# Patient Record
Sex: Female | Born: 1950 | Race: Black or African American | Hispanic: No | Smoking: Never smoker
Health system: Southern US, Community
[De-identification: ages and names within clinical notes are randomized; demographics above are authoritative.]

## PROBLEM LIST (undated history)

## (undated) DIAGNOSIS — R519 Headache, unspecified: Secondary | ICD-10-CM

## (undated) DIAGNOSIS — M25511 Pain in right shoulder: Secondary | ICD-10-CM

## (undated) DIAGNOSIS — I499 Cardiac arrhythmia, unspecified: Secondary | ICD-10-CM

## (undated) DIAGNOSIS — R51 Headache: Secondary | ICD-10-CM

## (undated) DIAGNOSIS — N390 Urinary tract infection, site not specified: Secondary | ICD-10-CM

## (undated) DIAGNOSIS — R011 Cardiac murmur, unspecified: Secondary | ICD-10-CM

## (undated) DIAGNOSIS — M1990 Unspecified osteoarthritis, unspecified site: Secondary | ICD-10-CM

## (undated) DIAGNOSIS — I10 Essential (primary) hypertension: Secondary | ICD-10-CM

## (undated) HISTORY — DX: Essential (primary) hypertension: I10

## (undated) HISTORY — PX: TUBAL LIGATION: SHX77

---

## 2010-01-16 HISTORY — PX: DILATION AND CURETTAGE OF UTERUS: SHX78

## 2015-02-19 ENCOUNTER — Encounter: Payer: Self-pay | Admitting: *Deleted

## 2015-03-17 ENCOUNTER — Other Ambulatory Visit (HOSPITAL_COMMUNITY)
Admission: RE | Admit: 2015-03-17 | Discharge: 2015-03-17 | Disposition: A | Discharge status: Home / Self Care | Payer: Self-pay | Source: Ambulatory Visit | Attending: Obstetrics & Gynecology | Admitting: Obstetrics & Gynecology

## 2015-03-17 ENCOUNTER — Encounter: Payer: Self-pay | Admitting: Obstetrics & Gynecology

## 2015-03-17 ENCOUNTER — Ambulatory Visit (INDEPENDENT_AMBULATORY_CARE_PROVIDER_SITE_OTHER): Payer: Self-pay | Admitting: Obstetrics & Gynecology

## 2015-03-17 ENCOUNTER — Encounter: Payer: Self-pay | Admitting: *Deleted

## 2015-03-17 VITALS — BP 127/70 | HR 85 | Temp 98.6°F | Ht 63.25 in | Wt 199.8 lb

## 2015-03-17 DIAGNOSIS — N840 Polyp of corpus uteri: Secondary | ICD-10-CM | POA: Insufficient documentation

## 2015-03-17 DIAGNOSIS — N950 Postmenopausal bleeding: Secondary | ICD-10-CM | POA: Insufficient documentation

## 2015-03-17 DIAGNOSIS — Z124 Encounter for screening for malignant neoplasm of cervix: Secondary | ICD-10-CM

## 2015-03-17 DIAGNOSIS — Z1151 Encounter for screening for human papillomavirus (HPV): Secondary | ICD-10-CM

## 2015-03-17 NOTE — Patient Instructions (Signed)
Endometrial Biopsy Endometrial biopsy is a procedure in which a tissue sample is taken from inside the uterus. The tissue sample is then looked at under a microscope to see if the tissue is normal or abnormal. The endometrium is the lining of the uterus. This procedure helps determine where you are in your menstrual cycle and how hormone levels are affecting the lining of the uterus. This procedure may also be used to evaluate uterine bleeding or to diagnose endometrial cancer, tuberculosis, polyps, or inflammatory conditions.  LET YOUR HEALTH CARE PROVIDER KNOW ABOUT:  Any allergies you have.  All medicines you are taking, including vitamins, herbs, eye drops, creams, and over-the-counter medicines.  Previous problems you or members of your family have had with the use of anesthetics.  Any blood disorders you have.  Previous surgeries you have had.  Medical conditions you have.  Possibility of pregnancy. RISKS AND COMPLICATIONS Generally, this is a safe procedure. However, as with any procedure, complications can occur. Possible complications include:  Bleeding.  Pelvic infection.  Puncture of the uterine wall with the biopsy device (rare). BEFORE THE PROCEDURE   Keep a record of your menstrual cycles as directed by your health care provider. You may need to schedule your procedure for a specific time in your cycle.  You may want to bring a sanitary pad to wear home after the procedure.  Arrange for someone to drive you home after the procedure if you will be given a medicine to help you relax (sedative). PROCEDURE   You may be given a sedative to relax you.  You will lie on an exam table with your feet and legs supported as in a pelvic exam.  Your health care provider will insert an instrument (speculum) into your vagina to see your cervix.  Your cervix will be cleansed with an antiseptic solution. A medicine (local anesthetic) will be used to numb the cervix.  A forceps  instrument (tenaculum) will be used to hold your cervix steady for the biopsy.  A thin, rodlike instrument (uterine sound) will be inserted through your cervix to determine the length of your uterus and the location where the biopsy sample will be removed.  A thin, flexible tube (catheter) will be inserted through your cervix and into the uterus. The catheter is used to collect the biopsy sample from your endometrial tissue.  The catheter and speculum will then be removed, and the tissue sample will be sent to a lab for examination. AFTER THE PROCEDURE  You will rest in a recovery area until you are ready to go home.  You may have mild cramping and a small amount of vaginal bleeding for a few days after the procedure. This is normal.  Make sure you find out how to get your test results.   This information is not intended to replace advice given to you by your health care provider. Make sure you discuss any questions you have with your health care provider.   Document Released: 05/05/2004 Document Revised: 09/04/2012 Document Reviewed: 06/19/2012 Elsevier Interactive Patient Education 2016 Elsevier Inc.  

## 2015-03-17 NOTE — Progress Notes (Signed)
Here today for periods of bleeding about 2 months ago. States period had stopped about 4-5 years ago. Since then had spotted a few times.

## 2015-03-17 NOTE — Progress Notes (Signed)
Patient ID: Tanya Nunez, female   DOB: [birth year], 65 y.o.   MRN: [id number]  Chief Complaint  Patient presents with  . Postmenopausal bleeding  referred by [facility], bleeding in December, has had spotting occasionally for years  HPI Tanya Nunez is a 65 y.o. female.  [id number] No LMP recorded. Patient is postmenopausal.Age 66 approximately. No bleeding now but bled like a period for a few days in December. Only pain is right side low back pain   HPI  Past Medical History  Diagnosis Date  . Hypertension     Past Surgical History  Procedure Laterality Date  . Dilation and curettage of uterus  2012  states polyp was Dx  Family History  Problem Relation Age of Onset  . Colonic polyp      Social History Social History  Substance Use Topics  . Smoking status: Never Smoker   . Smokeless tobacco: Never Used  . Alcohol Use: No    No Known Allergies  Current Outpatient Prescriptions  Medication Sig Dispense Refill  . aspirin EC 81 MG tablet Take 81 mg by mouth.    . hydrochlorothiazide (HYDRODIURIL) 25 MG tablet Take 25 mg by mouth daily.    Marland Kitchen losartan (COZAAR) 50 MG tablet Take 50 mg by mouth.    . naproxen (NAPROSYN) 500 MG tablet Take 500 mg by mouth 2 (two) times daily with a meal.    . VITAMIN D, CHOLECALCIFEROL, PO Take 1 tablet by mouth daily.     No current facility-administered medications for this visit.    Review of Systems Review of Systems  Blood pressure 127/70, pulse 85, temperature 98.6 F (37 C), height 5' 3.25" (1.607 m), weight 199 lb 12.8 oz (90.629 kg).  Physical Exam Physical Exam  Constitutional: She appears well-developed. No distress.  Pulmonary/Chest: Effort normal. No respiratory distress.  Genitourinary: Vagina normal. Vaginal discharge: scant pink staining.  Cervix normal, pap done. Uterus 6 week size, no masses  Psychiatric: She has a normal mood and affect. Her behavior is normal.  Patient given informed consent, signed copy in the  chart, time out was performed. Appropriate time out taken. . The patient was placed in the lithotomy position and the cervix brought into view with sterile speculum.  Portio of cervix cleansed x 2 with betadine swabs.  A tenaculum was placed in the anterior lip of the cervix.  The uterus was sounded for depth of 10 cm. A pipelle was introduced to into the uterus, suction created,  and an endometrial sample was obtained. All equipment was removed and accounted for.  The patient tolerated the procedure well.   .  Data Reviewed D&C 2007 path  Result Narrative    Result Narrative     Assessment    Postmenopausal bleeding. Pap and EMBX done     Plan    Patient given post procedure instructions. The patient will return in 2 weeks for results Pelvic US to evaluate endometrium and for pathology.        Tanya Nunez 03/17/2015, 1:44 PM

## 2015-03-18 LAB — WET PREP, GENITAL
Trich, Wet Prep: NONE SEEN
Yeast Wet Prep HPF POC: NONE SEEN

## 2015-03-22 ENCOUNTER — Telehealth: Payer: Self-pay | Admitting: General Practice

## 2015-03-22 LAB — CYTOLOGY - PAP

## 2015-03-22 NOTE — Telephone Encounter (Signed)
Per Dr Roselie Awkward patient's pap & endo bx were normal. Called patient, no answer- left message stating we are trying to reach you with results, please call us back at the clinics

## 2015-03-23 ENCOUNTER — Ambulatory Visit (HOSPITAL_COMMUNITY)
Admission: RE | Admit: 2015-03-23 | Discharge: 2015-03-23 | Disposition: A | Discharge status: Home / Self Care | Payer: Self-pay | Source: Ambulatory Visit | Attending: Obstetrics & Gynecology | Admitting: Obstetrics & Gynecology

## 2015-03-23 DIAGNOSIS — N950 Postmenopausal bleeding: Secondary | ICD-10-CM | POA: Insufficient documentation

## 2015-03-23 DIAGNOSIS — D259 Leiomyoma of uterus, unspecified: Secondary | ICD-10-CM | POA: Insufficient documentation

## 2015-03-23 NOTE — Telephone Encounter (Signed)
Patient called and left message stating she is returning our call. Called patient and informed her of results. Patient verbalized understanding & had no questions

## 2015-04-07 ENCOUNTER — Ambulatory Visit (INDEPENDENT_AMBULATORY_CARE_PROVIDER_SITE_OTHER): Payer: Self-pay | Admitting: Obstetrics & Gynecology

## 2015-04-07 ENCOUNTER — Encounter: Payer: Self-pay | Admitting: Obstetrics & Gynecology

## 2015-04-07 ENCOUNTER — Other Ambulatory Visit: Payer: Self-pay | Admitting: Obstetrics & Gynecology

## 2015-04-07 VITALS — BP 147/68 | HR 86 | Temp 98.3°F | Ht 66.0 in | Wt 201.6 lb

## 2015-04-07 DIAGNOSIS — R9389 Abnormal findings on diagnostic imaging of other specified body structures: Secondary | ICD-10-CM | POA: Insufficient documentation

## 2015-04-07 DIAGNOSIS — N950 Postmenopausal bleeding: Secondary | ICD-10-CM | POA: Insufficient documentation

## 2015-04-07 DIAGNOSIS — E669 Obesity, unspecified: Secondary | ICD-10-CM

## 2015-04-07 DIAGNOSIS — R938 Abnormal findings on diagnostic imaging of other specified body structures: Secondary | ICD-10-CM

## 2015-04-07 MED ORDER — MEGESTROL ACETATE 40 MG PO TABS: 40.0000 mg | ORAL_TABLET | Freq: Two times a day (BID) | ORAL | Status: DC

## 2015-04-07 NOTE — Progress Notes (Signed)
   Subjective:    Patient ID: Tanya Nunez, female    DOB: 1950/02/20, 65 y.o.   MRN: [id number]  HPI  65 yo AA lady here for follow up of her PMB work up. She had some PMB in 12/16. She had a pap, EMBX, and gyn u/s 2 weeks ago. All were normal athough the u/s showed a 5 cm fibroid and a 18 mm endometrium.  Review of Systems     Objective:   Physical Exam [id number] Breathing, conversing, and ambulating well Abd- benign    Assessment & Plan:  PMB with thickened endometrium- rec megace BID for 3 months and then recheck u/s for uterine lining

## 2015-04-07 NOTE — Progress Notes (Signed)
Patient ID: Tanya Nunez, female   DOB: 1950-07-17, 65 y.o.   MRN: [id number] [place] scheduled for June 20th @ 0900.  Pt notified.

## 2015-07-08 ENCOUNTER — Ambulatory Visit (HOSPITAL_COMMUNITY)
Admission: RE | Admit: 2015-07-08 | Discharge: 2015-07-08 | Disposition: A | Discharge status: Home / Self Care | Payer: Medicare Other | Source: Ambulatory Visit | Attending: Obstetrics & Gynecology | Admitting: Obstetrics & Gynecology

## 2015-07-08 DIAGNOSIS — R938 Abnormal findings on diagnostic imaging of other specified body structures: Secondary | ICD-10-CM | POA: Insufficient documentation

## 2015-07-08 DIAGNOSIS — N858 Other specified noninflammatory disorders of uterus: Secondary | ICD-10-CM | POA: Diagnosis not present

## 2015-07-08 DIAGNOSIS — D259 Leiomyoma of uterus, unspecified: Secondary | ICD-10-CM | POA: Insufficient documentation

## 2015-07-08 DIAGNOSIS — R9389 Abnormal findings on diagnostic imaging of other specified body structures: Secondary | ICD-10-CM

## 2015-08-25 ENCOUNTER — Ambulatory Visit (INDEPENDENT_AMBULATORY_CARE_PROVIDER_SITE_OTHER): Payer: Medicare Other | Admitting: Obstetrics & Gynecology

## 2015-08-25 ENCOUNTER — Encounter: Payer: Self-pay | Admitting: Obstetrics & Gynecology

## 2015-08-25 VITALS — BP 164/72 | HR 93 | Wt 191.3 lb

## 2015-08-25 DIAGNOSIS — N950 Postmenopausal bleeding: Secondary | ICD-10-CM

## 2015-08-25 NOTE — Progress Notes (Signed)
   Subjective:    Patient ID: Tanya Nunez, female    DOB: 05-22-1950, 65 y.o.   MRN: [id number]  HPI 65 yo AA P2 here for the results of her gyn u/s. She has had PMB, h/o EMBX, pap smear (all normal). U/s shows fibroids and a thick endometrium. She took megace and endometrium still is thick, 29 mm.    Review of Systems     Objective:   Physical Exam  WNWHBFNAD Breathing, conversing, and ambulating normally     Assessment & Plan:  Thick endometrium, PMB- plan for d&c
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
## 2015-08-27 ENCOUNTER — Encounter (HOSPITAL_COMMUNITY): Payer: Self-pay | Admitting: *Deleted

## 2015-08-30 ENCOUNTER — Other Ambulatory Visit: Payer: Self-pay

## 2015-08-30 ENCOUNTER — Encounter (HOSPITAL_COMMUNITY): Payer: Self-pay

## 2015-08-30 ENCOUNTER — Encounter (HOSPITAL_COMMUNITY)
Admission: RE | Admit: 2015-08-30 | Discharge: 2015-08-30 | Disposition: A | Discharge status: Home / Self Care | Payer: Medicare Other | Source: Ambulatory Visit | Attending: Obstetrics & Gynecology | Admitting: Obstetrics & Gynecology

## 2015-08-30 DIAGNOSIS — R938 Abnormal findings on diagnostic imaging of other specified body structures: Secondary | ICD-10-CM | POA: Diagnosis not present

## 2015-08-30 DIAGNOSIS — N950 Postmenopausal bleeding: Secondary | ICD-10-CM | POA: Diagnosis present

## 2015-08-30 DIAGNOSIS — I10 Essential (primary) hypertension: Secondary | ICD-10-CM | POA: Diagnosis not present

## 2015-08-30 DIAGNOSIS — Z7982 Long term (current) use of aspirin: Secondary | ICD-10-CM | POA: Diagnosis not present

## 2015-08-30 HISTORY — DX: Unspecified osteoarthritis, unspecified site: M19.90

## 2015-08-30 LAB — CBC
HCT: 38.5 % (ref 36.0–46.0)
Hemoglobin: 12.6 g/dL (ref 12.0–15.0)
MCH: 31.5 pg (ref 26.0–34.0)
MCHC: 32.7 g/dL (ref 30.0–36.0)
MCV: 96.3 fL (ref 78.0–100.0)
PLATELETS: 391 10*3/uL (ref 150–400)
RBC: 4 MIL/uL (ref 3.87–5.11)
RDW: 13.2 % (ref 11.5–15.5)
WBC: 9.2 10*3/uL (ref 4.0–10.5)

## 2015-08-30 LAB — BASIC METABOLIC PANEL
Anion gap: 5 (ref 5–15)
BUN: 12 mg/dL (ref 6–20)
CHLORIDE: 109 mmol/L (ref 101–111)
CO2: 24 mmol/L (ref 22–32)
CREATININE: 1.05 mg/dL — AB (ref 0.44–1.00)
Calcium: 8.9 mg/dL (ref 8.9–10.3)
GFR, EST NON AFRICAN AMERICAN: 55 mL/min — AB (ref >60)
Glucose, Bld: 133 mg/dL — ABNORMAL HIGH (ref 65–99)
POTASSIUM: 4 mmol/L (ref 3.5–5.1)
SODIUM: 138 mmol/L (ref 135–145)

## 2015-08-30 NOTE — Anesthesia Preprocedure Evaluation (Addendum)
Anesthesia Evaluation  Patient identified by MRN, date of birth, ID band Patient awake    Reviewed: Allergy & Precautions, NPO status , Patient's Chart, lab work & pertinent test results  History of Anesthesia Complications Negative for: history of anesthetic complications  Airway Mallampati: I  TM Distance: >3 FB Neck ROM: Full    Dental  (+) Edentulous Upper, Dental Advisory Given   Pulmonary neg pulmonary ROS,    Pulmonary exam normal breath sounds clear to auscultation       Cardiovascular hypertension, Pt. on medications (-) angina(-) Past MI, (-) Cardiac Stents and (-) CABG  Rhythm:Regular Rate:Normal     Neuro/Psych negative neurological ROS     GI/Hepatic negative GI ROS, Neg liver ROS,   Endo/Other  negative endocrine ROS  Renal/GU negative Renal ROS     Musculoskeletal  (+) Arthritis ,   Abdominal (+) + obese,   Peds  Hematology negative hematology ROS (+)   Anesthesia Other Findings   Reproductive/Obstetrics                            Anesthesia Physical Anesthesia Plan  ASA: II  Anesthesia Plan: General   Post-op Pain Management:    Induction: Intravenous  Airway Management Planned: LMA  Additional Equipment:   Intra-op Plan:   Post-operative Plan: Extubation in OR  Informed Consent: I have reviewed the patients History and Physical, chart, labs and discussed the procedure including the risks, benefits and alternatives for the proposed anesthesia with the patient or authorized representative who has indicated his/her understanding and acceptance.   Dental advisory given  Plan Discussed with:   Anesthesia Plan Comments:       Anesthesia Quick Evaluation

## 2015-08-30 NOTE — Patient Instructions (Addendum)
Your procedure is scheduled on: Tuesday, 8/15  Enter through the Main Entrance of [facility] at: [place] up the phone at the desk and dial [phone].  Call this number if you have problems the morning of surgery: [phone].  Remember: Do NOT eat or drink after midnight tonight 8/14  Take these medicines the morning of surgery with a SIP OF WATER:  Losartan and hctz  Do NOT wear jewelry (body piercing), metal hair clips/bobby pins, make-up, or nail polish.  Do NOT wear lotions, powders, or perfumes.  You may wear deoderant.  Do NOT shave for 48 hours prior to surgery.  Do NOT bring valuables to the hospital.  Dentures or bridgework may not be worn into surgery.  Have a responsible adult drive you home and stay with you for 24 hours after your procedure.  Home with friend Tanya Nunez cell [phone].

## 2015-08-31 ENCOUNTER — Ambulatory Visit (HOSPITAL_COMMUNITY): Payer: Medicare Other | Admitting: Anesthesiology

## 2015-08-31 ENCOUNTER — Ambulatory Visit (HOSPITAL_COMMUNITY)
Admission: RE | Admit: 2015-08-31 | Discharge: 2015-08-31 | Disposition: A | Discharge status: Home / Self Care | Payer: Medicare Other | Source: Ambulatory Visit | Attending: Obstetrics & Gynecology | Admitting: Obstetrics & Gynecology

## 2015-08-31 ENCOUNTER — Encounter (HOSPITAL_COMMUNITY)
Admission: RE | Disposition: A | Discharge status: Home / Self Care | Payer: Self-pay | Source: Ambulatory Visit | Attending: Obstetrics & Gynecology

## 2015-08-31 ENCOUNTER — Encounter (HOSPITAL_COMMUNITY): Payer: Self-pay | Admitting: *Deleted

## 2015-08-31 DIAGNOSIS — N950 Postmenopausal bleeding: Secondary | ICD-10-CM | POA: Insufficient documentation

## 2015-08-31 DIAGNOSIS — Z7982 Long term (current) use of aspirin: Secondary | ICD-10-CM | POA: Insufficient documentation

## 2015-08-31 DIAGNOSIS — I10 Essential (primary) hypertension: Secondary | ICD-10-CM | POA: Diagnosis not present

## 2015-08-31 DIAGNOSIS — R938 Abnormal findings on diagnostic imaging of other specified body structures: Secondary | ICD-10-CM | POA: Insufficient documentation

## 2015-08-31 HISTORY — PX: DILATION AND CURETTAGE OF UTERUS: SHX78

## 2015-08-31 SURGERY — DILATION AND CURETTAGE
Anesthesia: General | Site: Vagina

## 2015-08-31 MED ORDER — OXYCODONE-ACETAMINOPHEN 5-325 MG PO TABS: 1.0000 | ORAL_TABLET | Freq: Four times a day (QID) | ORAL | 0 refills | Status: DC | PRN

## 2015-08-31 MED ORDER — FENTANYL CITRATE (PF) 100 MCG/2ML IJ SOLN
INTRAMUSCULAR | Status: AC
  Filled 2015-08-31: qty 2

## 2015-08-31 MED ORDER — FENTANYL CITRATE (PF) 100 MCG/2ML IJ SOLN: 25.0000 ug | INTRAMUSCULAR | Status: DC | PRN

## 2015-08-31 MED ORDER — KETOROLAC TROMETHAMINE 30 MG/ML IJ SOLN
INTRAMUSCULAR | Status: DC | PRN
  Administered 2015-08-31: 30 mg via INTRAVENOUS

## 2015-08-31 MED ORDER — PROPOFOL 10 MG/ML IV BOLUS
INTRAVENOUS | Status: AC
  Filled 2015-08-31: qty 20

## 2015-08-31 MED ORDER — BUPIVACAINE HCL 0.5 % IJ SOLN
INTRAMUSCULAR | Status: DC | PRN
  Administered 2015-08-31: 10 mL

## 2015-08-31 MED ORDER — DEXAMETHASONE SODIUM PHOSPHATE 4 MG/ML IJ SOLN
INTRAMUSCULAR | Status: AC
  Filled 2015-08-31: qty 1

## 2015-08-31 MED ORDER — ONDANSETRON HCL 4 MG/2ML IJ SOLN: 4.0000 mg | Freq: Once | INTRAMUSCULAR | Status: DC | PRN

## 2015-08-31 MED ORDER — ONDANSETRON HCL 4 MG/2ML IJ SOLN
INTRAMUSCULAR | Status: AC
  Filled 2015-08-31: qty 2

## 2015-08-31 MED ORDER — LIDOCAINE HCL (CARDIAC) 20 MG/ML IV SOLN
INTRAVENOUS | Status: AC
  Filled 2015-08-31: qty 5

## 2015-08-31 MED ORDER — IBUPROFEN 600 MG PO TABS
600.0000 mg | ORAL_TABLET | Freq: Four times a day (QID) | ORAL | 1 refills | Status: DC | PRN
Start: 2015-08-31 — End: 2015-09-22

## 2015-08-31 MED ORDER — LIDOCAINE HCL (CARDIAC) 20 MG/ML IV SOLN
INTRAVENOUS | Status: DC | PRN
  Administered 2015-08-31: 80 mg via INTRAVENOUS

## 2015-08-31 MED ORDER — FENTANYL CITRATE (PF) 100 MCG/2ML IJ SOLN
INTRAMUSCULAR | Status: DC | PRN
  Administered 2015-08-31: 100 ug via INTRAVENOUS

## 2015-08-31 MED ORDER — LACTATED RINGERS IV SOLN
INTRAVENOUS | Status: DC
  Administered 2015-08-31: 09:00:00 via INTRAVENOUS

## 2015-08-31 MED ORDER — SCOPOLAMINE 1 MG/3DAYS TD PT72
MEDICATED_PATCH | TRANSDERMAL | Status: AC
  Filled 2015-08-31: qty 1

## 2015-08-31 MED ORDER — DEXAMETHASONE SODIUM PHOSPHATE 10 MG/ML IJ SOLN
INTRAMUSCULAR | Status: DC | PRN
  Administered 2015-08-31: 4 mg via INTRAVENOUS

## 2015-08-31 MED ORDER — MIDAZOLAM HCL 2 MG/2ML IJ SOLN
INTRAMUSCULAR | Status: AC
  Filled 2015-08-31: qty 2

## 2015-08-31 MED ORDER — PROPOFOL 10 MG/ML IV BOLUS
INTRAVENOUS | Status: DC | PRN
  Administered 2015-08-31: 150 mg via INTRAVENOUS

## 2015-08-31 MED ORDER — MIDAZOLAM HCL 2 MG/2ML IJ SOLN
INTRAMUSCULAR | Status: DC | PRN
  Administered 2015-08-31: 2 mg via INTRAVENOUS

## 2015-08-31 MED ORDER — LACTATED RINGERS IV SOLN
INTRAVENOUS | Status: DC | PRN
  Administered 2015-08-31: 10:00:00 via INTRAVENOUS

## 2015-08-31 MED ORDER — BUPIVACAINE HCL (PF) 0.5 % IJ SOLN
INTRAMUSCULAR | Status: AC
  Filled 2015-08-31: qty 30

## 2015-08-31 MED ORDER — ONDANSETRON HCL 4 MG/2ML IJ SOLN
INTRAMUSCULAR | Status: DC | PRN
  Administered 2015-08-31: 4 mg via INTRAVENOUS

## 2015-08-31 SURGICAL SUPPLY — 13 items
CLOTH BEACON ORANGE TIMEOUT ST (SAFETY) ×3 IMPLANT
CONTAINER PREFILL 10% NBF 60ML (FORM) ×3 IMPLANT
DILATOR CANAL MILEX (MISCELLANEOUS) IMPLANT
GLOVE BIO SURGEON STRL SZ 6.5 (GLOVE) ×2 IMPLANT
GLOVE BIO SURGEONS STRL SZ 6.5 (GLOVE) ×1
GLOVE BIOGEL PI IND STRL 7.0 (GLOVE) ×1 IMPLANT
GLOVE BIOGEL PI INDICATOR 7.0 (GLOVE) ×2
GOWN STRL REUS W/TWL LRG LVL3 (GOWN DISPOSABLE) ×6 IMPLANT
NEEDLE SPNL 18GX3.5 QUINCKE PK (NEEDLE) ×3 IMPLANT
PACK VAGINAL MINOR WOMEN LF (CUSTOM PROCEDURE TRAY) ×3 IMPLANT
PAD OB MATERNITY 4.3X12.25 (PERSONAL CARE ITEMS) ×3 IMPLANT
PAD PREP 24X48 CUFFED NSTRL (MISCELLANEOUS) ×3 IMPLANT
TOWEL OR 17X24 6PK STRL BLUE (TOWEL DISPOSABLE) ×6 IMPLANT

## 2015-08-31 NOTE — Transfer of Care (Signed)
Immediate Anesthesia Transfer of Care Note  Patient: Tanya Nunez  Procedure(s) Performed: Procedure(s): DILATATION AND CURETTAGE (N/A)  Patient Location: PACU  Anesthesia Type:General  Level of Consciousness: awake, alert  and oriented  Airway & Oxygen Therapy: Patient Spontanous Breathing and Patient connected to nasal cannula oxygen  Post-op Assessment: Report given to RN and Post -op Vital signs reviewed and stable  Post vital signs: Reviewed and stable  Last Vitals:  Vitals:   08/31/15 0859  BP: 126/85  Pulse: 77  Resp: 18  Temp: 37 C    Last Pain:  Vitals:   08/31/15 0859  TempSrc: Oral         Complications: No apparent anesthesia complications

## 2015-08-31 NOTE — Op Note (Signed)
08/31/2015  10:28 AM  PATIENT:  Tanya Nunez  65 y.o. female  PRE-OPERATIVE DIAGNOSIS:   PMB, fibroids and a thick endometrium  POST-OPERATIVE DIAGNOSIS:  PMB, fibroids and a thick endometrium  PROCEDURE:  Procedure(s): DILATATION AND CURETTAGE (N/A)  SURGEON:  Surgeon(s) and Role:    * Emily Filbert, MD - Primary   ANESTHESIA:   general  EBL:  Total I/O In: -  Out: 40 [Urine:25; Blood:15]  BLOOD ADMINISTERED:none  DRAINS: none   LOCAL MEDICATIONS USED:  MARCAINE     SPECIMEN:  Source of Specimen:  uterine curettings  DISPOSITION OF SPECIMEN:  PATHOLOGY  COUNTS:  YES  TOURNIQUET:  * No tourniquets in log *  DICTATION: .Dragon Dictation  PLAN OF CARE: Discharge to home after PACU  PATIENT DISPOSITION:  PACU - hemodynamically stable.   Delay start of Pharmacological VTE agent (>24hrs) due to surgical blood loss or risk of bleeding: not applicable    The risks, benefits, and alternatives of surgery were explained, understood, and accepted. All questions were answered. Consents were signed. In the operating room MAC anesthesia was applied without complication, and she was placed in the dorsal lithotomy position. Her vagina was prepped and draped in the usual sterile fashion.  A bimanual exam revealed a 10 week size , anteverted mobile uterus. Her adnexa were nonenlarged. A speculum was placed and a single-tooth tenaculum was used to grasp the anterior lip of her cervix. A total of 10 mL of 0.5% Marcaine was used to perform a paracervical block. Her uterus sounded to 12 cm. Her cervix was carefully and slowly dilated to accommodate a small curette. A curettage was done in all quadrants and the fundus of the uterus. A large amount of polypoid-type  tissue was obtained. A gritty sensation was appreciated throughout. There was no bleeding noted at the end of the case. She was taken to the recovery room after being extubated. She tolerated the procedure well.

## 2015-08-31 NOTE — H&P (Signed)
Tanya Nunez is an 64 yo AA P2 here for a d&Nunez. She has had PMB, h/o EMBX, pap smear (all normal). U/s shows fibroids and a thick endometrium. She took megace and endometrium still is thick, 29 mm.      No LMP recorded. Patient is postmenopausal.    Past Medical History:  Diagnosis Date  . Arthritis    hands, knees - no meds  . Hypertension   . SVD (spontaneous vaginal delivery)    x 4    Past Surgical History:  Procedure Laterality Date  . DILATION AND CURETTAGE OF UTERUS  2012  . TUBAL LIGATION      Family History  Problem Relation Age of Onset  . Colonic polyp      Social History:  reports that she has never smoked. She has never used smokeless tobacco. She reports that she does not drink alcohol or use drugs.  Allergies: No Known Allergies  Prescriptions Prior to Admission  Medication Sig Dispense Refill Last Dose  . hydrochlorothiazide (HYDRODIURIL) 25 MG tablet Take 25 mg by mouth daily.   08/31/2015 at Unknown time  . losartan (COZAAR) 50 MG tablet Take 50 mg by mouth.   08/31/2015 at Unknown time  . aspirin EC 81 MG tablet Take 81 mg by mouth.   08/29/2015  . megestrol (MEGACE) 40 MG tablet Take 1 tablet (40 mg total) by mouth 2 (two) times daily. 60 tablet 2 08/29/2015  . naproxen (NAPROSYN) 500 MG tablet Take 500 mg by mouth 2 (two) times daily with a meal.   Taking  . VITAMIN D, CHOLECALCIFEROL, PO Take 1 tablet by mouth daily.   08/29/2015    ROS  Blood pressure 126/85, pulse 77, temperature 98.6 F (37 Nunez), temperature source Oral, resp. rate 18, SpO2 99 %. Physical Exam  Heart- rrr Lungs- CTAB Abd- benign  No results found for this or any previous visit (from the past 24 hour(s)).  No results found.  Assessment/Plan: PMB with thick endometrium- plan for d&Nunez  She understands the risks of surgery, including, but not to infection, bleeding, DVTs, damage to bowel, bladder, ureters. She wishes to proceed.     Tanya Nunez Tanya Nunez 08/31/2015, 10:01 AM

## 2015-08-31 NOTE — Discharge Instructions (Signed)
Dilation and Curettage or Vacuum Curettage, Care After Refer to this sheet in the next few weeks. These instructions provide you with information on caring for yourself after your procedure. Your health care provider may also give you more specific instructions. Your treatment has been planned according to current medical practices, but problems sometimes occur. Call your health care provider if you have any problems or questions after your procedure. WHAT TO EXPECT AFTER THE PROCEDURE After your procedure, it is typical to have light cramping and bleeding. This may last for 2 days to 2 weeks after the procedure. HOME CARE INSTRUCTIONS   Do not drive for 24 hours.  Wait 1 week before returning to strenuous activities.  Take your temperature 2 times a day for 4 days and write it down. Provide these temperatures to your health care provider if you develop a fever.  Avoid long periods of standing.  Avoid heavy lifting, pushing, or pulling. Do not lift anything heavier than 10 pounds (4.5 kg).  Limit stair climbing to once or twice a day.  Take rest periods often.  You may resume your usual diet.  Drink enough fluids to keep your urine clear or pale yellow.  Your usual bowel function should return. If you have constipation, you may:  Take a mild laxative with permission from your health care provider.  Add fruit and bran to your diet.  Drink more fluids.  Take showers instead of baths until your health care provider gives you permission to take baths.  Do not go swimming or use a hot tub until your health care provider approves.  Try to have someone with you or available to you the first 24-48 hours, especially if you were given a general anesthetic.  Do not douche, use tampons, or have sex (intercourse) for 2 weeks after the procedure.  Only take over-the-counter or prescription medicines as directed by your health care provider. Do not take aspirin. It can cause  bleeding.  Follow up with your health care provider as directed. SEEK MEDICAL CARE IF:   You have increasing cramps or pain that is not relieved with medicine.  You have abdominal pain that does not seem to be related to the same area of earlier cramping and pain.  You have bad smelling vaginal discharge.  You have a rash.  You are having problems with any medicine. SEEK IMMEDIATE MEDICAL CARE IF:   You have bleeding that is heavier than a normal menstrual period.  You have a fever.  You have chest pain.  You have shortness of breath.  You feel dizzy or feel like fainting.  You pass out.  You have pain in your shoulder strap area.  You have heavy vaginal bleeding with or without blood clots. MAKE SURE YOU:   Understand these instructions.  Will watch your condition.  Will get help right away if you are not doing well or get worse.   This information is not intended to replace advice given to you by your health care provider. Make sure you discuss any questions you have with your health care provider.   Document Released: 12/31/1999 Document Revised: 01/07/2013 Document Reviewed: 08/01/2012 Elsevier Interactive Patient Education 2016 Elsevier Inc.  

## 2015-08-31 NOTE — Anesthesia Procedure Notes (Signed)
Procedure Name: LMA Insertion Date/Time: 08/31/2015 10:03 AM Performed by: Jesten Cappuccio, Sheron Nightingale Pre-anesthesia Checklist: Patient identified, Emergency Drugs available, Suction available, Patient being monitored and Timeout performed Patient Re-evaluated:Patient Re-evaluated prior to inductionOxygen Delivery Method: Circle system utilized Preoxygenation: Pre-oxygenation with 100% oxygen Intubation Type: IV induction LMA: LMA inserted LMA Size: 4.0 Grade View: Grade I Number of attempts: 1

## 2015-08-31 NOTE — Anesthesia Postprocedure Evaluation (Signed)
Anesthesia Post Note  Patient: Tanya Nunez  Procedure(s) Performed: Procedure(s) (LRB): DILATATION AND CURETTAGE (N/A)  Patient location during evaluation: PACU Anesthesia Type: General Level of consciousness: awake and alert Pain management: pain level controlled Vital Signs Assessment: post-procedure vital signs reviewed and stable Respiratory status: spontaneous breathing, nonlabored ventilation and respiratory function stable Cardiovascular status: blood pressure returned to baseline and stable Postop Assessment: no signs of nausea or vomiting Anesthetic complications: no     Last Vitals:  Vitals:   08/31/15 1130 08/31/15 1145  BP: 123/68   Pulse: 64 65  Resp: 16 17  Temp:  36.8 C    Last Pain:  Vitals:   08/31/15 1145  TempSrc:   PainSc: 0-No pain   Pain Goal: Patients Stated Pain Goal: 3 (08/31/15 1145)               Nilda Simmer

## 2015-09-01 ENCOUNTER — Encounter (HOSPITAL_COMMUNITY): Payer: Self-pay | Admitting: Obstetrics & Gynecology

## 2015-09-21 ENCOUNTER — Telehealth: Payer: Self-pay | Admitting: *Deleted

## 2015-09-21 NOTE — Telephone Encounter (Addendum)
Pt left message stating that she had D&C by Dr. Hulan Fray about 3 weeks ago. She needs follow up appt and also has started having heavy bleeding w/clots. Please call back.  I returned pt's call and discussed her concern. She stated that she began having heavy bleeding and passing clots 3 days ago. As of today, the bleeding has slowed down some and she is no longer passing clots. I advised pt that Dr. Hulan Fray is not in the office this week however she may have appt with another physician in the office tomorrow @ 1340. Pt voiced understanding and agreed to appt as offered.

## 2015-09-22 ENCOUNTER — Encounter: Payer: Self-pay | Admitting: Family Medicine

## 2015-09-22 ENCOUNTER — Ambulatory Visit (INDEPENDENT_AMBULATORY_CARE_PROVIDER_SITE_OTHER): Payer: Medicare Other | Admitting: Family Medicine

## 2015-09-22 DIAGNOSIS — N950 Postmenopausal bleeding: Secondary | ICD-10-CM

## 2015-09-22 NOTE — Progress Notes (Signed)
   Subjective:    Patient ID: Tanya Nunez is a 65 y.o. female presenting with Menorrhagia  on 09/22/2015  HPI: Here to day to f/u following post-menopausal bleeding. Underwent D and C with Dr. Hulan Fray on 8/15. Since that time has not had any break from bleeding. Now passing a lot of clots. Pathology reviewed and noted progestational effect and necrosis. Has stopped the Megace.  Review of Systems  Constitutional: Negative for chills and fever.  Respiratory: Negative for shortness of breath.   Cardiovascular: Negative for chest pain.  Gastrointestinal: Negative for abdominal pain, nausea and vomiting.  Genitourinary: Negative for dysuria.  Skin: Negative for rash.      Objective:    BP (!) 143/68 (BP Location: Left Arm, Patient Position: Sitting, Cuff Size: Large)   Wt 197 lb 1.6 oz (89.4 kg)   BMI 33.83 kg/m  Physical Exam  Constitutional: She is oriented to person, place, and time. She appears well-developed and well-nourished. No distress.  HENT:  Head: Normocephalic and atraumatic.  Eyes: No scleral icterus.  Neck: Neck supple.  Cardiovascular: Normal rate.   Pulmonary/Chest: Effort normal.  Abdominal: Soft.  Neurological: She is alert and oriented to person, place, and time.  Skin: Skin is warm and dry.  Psychiatric: She has a normal mood and affect.      Assessment & Plan:   Problem List Items Addressed This Visit      Unprioritized   PMB (postmenopausal bleeding)    Given continued heavy bleeding, could do SIS vs. Hysteroscopy with additional sampling. --will proceed with this, due to previous pathology reports.       Other Visit Diagnoses   None.     Total face-to-face time with patient: 15 minutes. Over 50% of encounter was spent on counseling and coordination of care. Return in about 4 weeks (around 10/20/2015).  [name] 09/22/2015 2:07 PM

## 2015-09-22 NOTE — Patient Instructions (Addendum)
Hysteroscopy °Hysteroscopy is a procedure used for looking inside the womb (uterus). It may be done for various reasons, including: °· To evaluate abnormal bleeding, fibroid (benign, noncancerous) tumors, polyps, scar tissue (adhesions), and possibly cancer of the uterus. °· To look for lumps (tumors) and other uterine growths. °· To look for causes of why a woman cannot get pregnant (infertility), causes of recurrent loss of pregnancy (miscarriages), or a lost intrauterine device (IUD). °· To perform a sterilization by blocking the fallopian tubes from inside the uterus. °In this procedure, a thin, flexible tube with a tiny light and camera on the end of it (hysteroscope) is used to look inside the uterus. A hysteroscopy should be done right after a menstrual period to be sure you are not pregnant. °LET YOUR HEALTH CARE PROVIDER KNOW ABOUT:  °· Any allergies you have. °· All medicines you are taking, including vitamins, herbs, eye drops, creams, and over-the-counter medicines. °· Previous problems you or members of your family have had with the use of anesthetics. °· Any blood disorders you have. °· Previous surgeries you have had. °· Medical conditions you have. °RISKS AND COMPLICATIONS  °Generally, this is a safe procedure. However, as with any procedure, complications can occur. Possible complications include: °· Putting a hole in the uterus. °· Excessive bleeding. °· Infection. °· Damage to the cervix. °· Injury to other organs. °· Allergic reaction to medicines. °· Too much fluid used in the uterus for the procedure. °BEFORE THE PROCEDURE  °· Ask your health care provider about changing or stopping any regular medicines. °· Do not take aspirin or blood thinners for 1 week before the procedure, or as directed by your health care provider. These can cause bleeding. °· If you smoke, do not smoke for 2 weeks before the procedure. °· In some cases, a medicine is placed in the cervix the day before the procedure.  This medicine makes the cervix have a larger opening (dilate). This makes it easier for the instrument to be inserted into the uterus during the procedure. °· Do not eat or drink anything for at least 8 hours before the surgery. °· Arrange for someone to take you home after the procedure. °PROCEDURE  °· You may be given a medicine to relax you (sedative). You may also be given one of the following: °¨ A medicine that numbs the area around the cervix (local anesthetic). °¨ A medicine that makes you sleep through the procedure (general anesthetic). °· The hysteroscope is inserted through the vagina into the uterus. The camera on the hysteroscope sends a picture to a TV screen. This gives the surgeon a good view inside the uterus. °· During the procedure, air or a liquid is put into the uterus, which allows the surgeon to see better. °· Sometimes, tissue is gently scraped from inside the uterus. These tissue samples are sent to a lab for testing. °AFTER THE PROCEDURE  °· If you had a general anesthetic, you may be groggy for a couple hours after the procedure. °· If you had a local anesthetic, you will be able to go home as soon as you are stable and feel ready. °· You may have some cramping. This normally lasts for a couple days. °· You may have bleeding, which varies from light spotting for a few days to menstrual-like bleeding for 3-7 days. This is normal. °· If your test results are not back during the visit, make an appointment with your health care provider to find out the   results.   This information is not intended to replace advice given to you by your health care provider. Make sure you discuss any questions you have with your health care provider.   Document Released: 04/10/2000 Document Revised: 10/23/2012 Document Reviewed: 08/01/2012 Elsevier Interactive Patient Education [employer].

## 2015-09-22 NOTE — Assessment & Plan Note (Signed)
Given continued heavy bleeding, could do SIS vs. Hysteroscopy with additional sampling. --will proceed with this, due to previous pathology reports.

## 2015-09-27 ENCOUNTER — Encounter (HOSPITAL_COMMUNITY): Payer: Self-pay

## 2015-09-27 ENCOUNTER — Encounter (HOSPITAL_COMMUNITY)
Admission: RE | Admit: 2015-09-27 | Discharge: 2015-09-27 | Disposition: A | Discharge status: Home / Self Care | Payer: Medicare Other | Source: Ambulatory Visit | Attending: Family Medicine | Admitting: Family Medicine

## 2015-09-27 DIAGNOSIS — N840 Polyp of corpus uteri: Secondary | ICD-10-CM | POA: Diagnosis not present

## 2015-09-27 DIAGNOSIS — N950 Postmenopausal bleeding: Secondary | ICD-10-CM | POA: Diagnosis not present

## 2015-09-27 DIAGNOSIS — I10 Essential (primary) hypertension: Secondary | ICD-10-CM | POA: Diagnosis not present

## 2015-09-27 DIAGNOSIS — Z7982 Long term (current) use of aspirin: Secondary | ICD-10-CM | POA: Diagnosis not present

## 2015-09-27 HISTORY — DX: Headache: R51

## 2015-09-27 HISTORY — DX: Cardiac arrhythmia, unspecified: I49.9

## 2015-09-27 HISTORY — DX: Headache, unspecified: R51.9

## 2015-09-27 LAB — BASIC METABOLIC PANEL
ANION GAP: 4 — AB (ref 5–15)
BUN: 10 mg/dL (ref 6–20)
CHLORIDE: 108 mmol/L (ref 101–111)
CO2: 26 mmol/L (ref 22–32)
Calcium: 8.9 mg/dL (ref 8.9–10.3)
Creatinine, Ser: 0.92 mg/dL (ref 0.44–1.00)
Glucose, Bld: 123 mg/dL — ABNORMAL HIGH (ref 65–99)
POTASSIUM: 3.7 mmol/L (ref 3.5–5.1)
SODIUM: 138 mmol/L (ref 135–145)

## 2015-09-27 LAB — CBC
HCT: 29.5 % — ABNORMAL LOW (ref 36.0–46.0)
HEMOGLOBIN: 9.2 g/dL — AB (ref 12.0–15.0)
MCH: 31.2 pg (ref 26.0–34.0)
MCHC: 31.2 g/dL (ref 30.0–36.0)
MCV: 100 fL (ref 78.0–100.0)
PLATELETS: 431 10*3/uL — AB (ref 150–400)
RBC: 2.95 MIL/uL — AB (ref 3.87–5.11)
RDW: 14.1 % (ref 11.5–15.5)
WBC: 12.2 10*3/uL — AB (ref 4.0–10.5)

## 2015-09-27 NOTE — H&P (Signed)
  Tanya Nunez is an 65 y.o. [phone] female.   Chief Complaint: Postmenopausal bleeding HPI:  Underwent D and C with Dr. Hulan Fray on 8/15. Since that time has not had any break from bleeding. Now passing a lot of clots. Pathology reviewed and noted progestational effect and necrosis. Has stopped the Megace.  Past Medical History:  Diagnosis Date  . Arthritis    hands, knees - no meds  . Headache    history of Migraines  . Hypertension   . Irregular heart beats   . SVD (spontaneous vaginal delivery)    x 4    Past Surgical History:  Procedure Laterality Date  . DILATION AND CURETTAGE OF UTERUS  2012  . DILATION AND CURETTAGE OF UTERUS N/A 08/31/2015   Procedure: DILATATION AND CURETTAGE;  Surgeon: Emily Filbert, MD;  Location: [facility];  Service: Gynecology;  Laterality: N/A;  . TUBAL LIGATION      Family History  Problem Relation Age of Onset  . Colonic polyp     Social History:  reports that she has never smoked. She has never used smokeless tobacco. She reports that she does not drink alcohol or use drugs.  Allergies: No Known Allergies  No current facility-administered medications on file prior to encounter.    Current Outpatient Prescriptions on File Prior to Encounter  Medication Sig Dispense Refill  . aspirin EC 81 MG tablet Take 81 mg by mouth.    . hydrochlorothiazide (HYDRODIURIL) 25 MG tablet Take 25 mg by mouth daily.    Marland Kitchen losartan (COZAAR) 50 MG tablet Take 50 mg by mouth.    . naproxen (NAPROSYN) 500 MG tablet Take 500 mg by mouth 2 (two) times daily with a meal.    . oxyCODONE-acetaminophen (PERCOCET/ROXICET) 5-325 MG tablet Take 1-2 tablets by mouth every 6 (six) hours as needed. (Patient not taking: Reported on 09/22/2015) 10 tablet 0  . VITAMIN D, CHOLECALCIFEROL, PO Take 1 tablet by mouth daily.      A comprehensive review of systems was negative.  There were no vitals taken for this visit. General appearance: alert, cooperative and appears stated age Head:  Normocephalic, without obvious abnormality, atraumatic Neck: supple, symmetrical, trachea midline Lungs: normal effort Heart: regular rate and rhythm Abdomen: gravid, NT Extremities: Homans sign is negative, no sign of DVT Skin: Skin color, texture, turgor normal. No rashes or lesions Neurologic: Grossly normal   Lab Results  Component Value Date   WBC 12.2 (H) 09/27/2015   HGB 9.2 (L) 09/27/2015   HCT 29.5 (L) 09/27/2015   MCV 100.0 09/27/2015   PLT 431 (H) 09/27/2015   No results found for: PREGTESTUR, PREGSERUM, HCG, HCGQUANT   Assessment/Plan Principal Problem:   Postmenopausal bleeding  For D and C with hysteroscopy Risks include but are not limited to bleeding, infection, injury to surrounding structures, including bowel, bladder and ureters, blood clots, and death.  Likelihood of success is high.    [name] 09/27/2015, 5:17 PM

## 2015-09-27 NOTE — Patient Instructions (Signed)
Your procedure is scheduled on:  Thursday, Sept 14, 2017  Enter through the Main Entrance of [facility] at:  1:30PM  Pick up the phone at the desk and dial [phone].  Call this number if you have problems the morning of surgery: [phone].  Remember: Do NOT eat food:  After Midnight Wednesday  Do NOT drink clear liquids after:  11:00 AM day of surgery  Take these medicines the morning of surgery with a SIP OF WATER:  Hydrochlorothiazide, Losartan  Do NOT wear jewelry (body piercing), metal hair clips/bobby pins, make-up, or nail polish. Do NOT wear lotions, powders, or perfumes.  You may wear deodorant. Do NOT shave for 48 hours prior to surgery. Do NOT bring valuables to the hospital. Contacts, dentures, or bridgework may not be worn into surgery.  Have a responsible adult drive you home and stay with you for 24 hours after your procedure

## 2015-09-30 ENCOUNTER — Encounter (HOSPITAL_COMMUNITY)
Admission: RE | Disposition: A | Discharge status: Home / Self Care | Payer: Self-pay | Source: Ambulatory Visit | Attending: Family Medicine

## 2015-09-30 ENCOUNTER — Ambulatory Visit (HOSPITAL_COMMUNITY)
Admission: RE | Admit: 2015-09-30 | Discharge: 2015-09-30 | Disposition: A | Discharge status: Home / Self Care | Payer: Medicare Other | Source: Ambulatory Visit | Attending: Family Medicine | Admitting: Family Medicine

## 2015-09-30 ENCOUNTER — Ambulatory Visit (HOSPITAL_COMMUNITY): Payer: Medicare Other | Admitting: Anesthesiology

## 2015-09-30 ENCOUNTER — Encounter (HOSPITAL_COMMUNITY): Payer: Self-pay

## 2015-09-30 DIAGNOSIS — I10 Essential (primary) hypertension: Secondary | ICD-10-CM | POA: Diagnosis not present

## 2015-09-30 DIAGNOSIS — N840 Polyp of corpus uteri: Secondary | ICD-10-CM | POA: Diagnosis not present

## 2015-09-30 DIAGNOSIS — Z7982 Long term (current) use of aspirin: Secondary | ICD-10-CM | POA: Insufficient documentation

## 2015-09-30 DIAGNOSIS — N950 Postmenopausal bleeding: Secondary | ICD-10-CM | POA: Insufficient documentation

## 2015-09-30 HISTORY — PX: HYSTEROSCOPY WITH D & C: SHX1775

## 2015-09-30 LAB — ABO/RH: ABO/RH(D): O POS

## 2015-09-30 LAB — TYPE AND SCREEN
ABO/RH(D): O POS
ANTIBODY SCREEN: NEGATIVE

## 2015-09-30 SURGERY — DILATATION AND CURETTAGE /HYSTEROSCOPY
Anesthesia: General | Site: Vagina

## 2015-09-30 MED ORDER — BUPIVACAINE-EPINEPHRINE (PF) 0.25% -1:200000 IJ SOLN
INTRAMUSCULAR | Status: AC
Start: 2015-09-30 — End: 2015-09-30
  Filled 2015-09-30: qty 30

## 2015-09-30 MED ORDER — FENTANYL CITRATE (PF) 100 MCG/2ML IJ SOLN
INTRAMUSCULAR | Status: DC | PRN
  Administered 2015-09-30 (×2): 50 ug via INTRAVENOUS

## 2015-09-30 MED ORDER — ONDANSETRON HCL 4 MG/2ML IJ SOLN
INTRAMUSCULAR | Status: AC
  Filled 2015-09-30: qty 2

## 2015-09-30 MED ORDER — DEXAMETHASONE SODIUM PHOSPHATE 4 MG/ML IJ SOLN
INTRAMUSCULAR | Status: AC
  Filled 2015-09-30: qty 1

## 2015-09-30 MED ORDER — LACTATED RINGERS IV SOLN: INTRAVENOUS | Status: DC

## 2015-09-30 MED ORDER — METOCLOPRAMIDE HCL 5 MG/ML IJ SOLN: 10.0000 mg | Freq: Once | INTRAMUSCULAR | Status: DC | PRN

## 2015-09-30 MED ORDER — ONDANSETRON HCL 4 MG/2ML IJ SOLN
INTRAMUSCULAR | Status: DC | PRN
  Administered 2015-09-30: 4 mg via INTRAVENOUS

## 2015-09-30 MED ORDER — PHENYLEPHRINE HCL 10 MG/ML IJ SOLN
INTRAMUSCULAR | Status: DC | PRN
  Administered 2015-09-30 (×3): 40 ug via INTRAVENOUS
  Administered 2015-09-30 (×3): 80 ug via INTRAVENOUS
  Administered 2015-09-30 (×3): 40 ug via INTRAVENOUS
  Administered 2015-09-30: 80 ug via INTRAVENOUS
  Administered 2015-09-30: 40 ug via INTRAVENOUS

## 2015-09-30 MED ORDER — LIDOCAINE HCL (CARDIAC) 20 MG/ML IV SOLN
INTRAVENOUS | Status: DC | PRN
  Administered 2015-09-30: 100 mg via INTRAVENOUS

## 2015-09-30 MED ORDER — PHENYLEPHRINE 40 MCG/ML (10ML) SYRINGE FOR IV PUSH (FOR BLOOD PRESSURE SUPPORT)
PREFILLED_SYRINGE | INTRAVENOUS | Status: AC
  Filled 2015-09-30: qty 20

## 2015-09-30 MED ORDER — SODIUM CHLORIDE 0.9 % IR SOLN
Status: DC | PRN
  Administered 2015-09-30: 3000 mL

## 2015-09-30 MED ORDER — DEXAMETHASONE SODIUM PHOSPHATE 10 MG/ML IJ SOLN
INTRAMUSCULAR | Status: DC | PRN
  Administered 2015-09-30: 4 mg via INTRAVENOUS

## 2015-09-30 MED ORDER — FENTANYL CITRATE (PF) 100 MCG/2ML IJ SOLN: 25.0000 ug | INTRAMUSCULAR | Status: DC | PRN

## 2015-09-30 MED ORDER — LACTATED RINGERS IV SOLN
INTRAVENOUS | Status: DC
  Administered 2015-09-30 (×2): via INTRAVENOUS

## 2015-09-30 MED ORDER — PROPOFOL 10 MG/ML IV BOLUS
INTRAVENOUS | Status: DC | PRN
  Administered 2015-09-30: 180 mg via INTRAVENOUS

## 2015-09-30 MED ORDER — HYDROCODONE-ACETAMINOPHEN 7.5-325 MG PO TABS: 1.0000 | ORAL_TABLET | Freq: Once | ORAL | Status: DC | PRN

## 2015-09-30 MED ORDER — PHENYLEPHRINE 40 MCG/ML (10ML) SYRINGE FOR IV PUSH (FOR BLOOD PRESSURE SUPPORT)
PREFILLED_SYRINGE | INTRAVENOUS | Status: AC
  Filled 2015-09-30: qty 10

## 2015-09-30 MED ORDER — MIDAZOLAM HCL 2 MG/2ML IJ SOLN
INTRAMUSCULAR | Status: AC
  Filled 2015-09-30: qty 2

## 2015-09-30 MED ORDER — PROPOFOL 10 MG/ML IV BOLUS
INTRAVENOUS | Status: AC
  Filled 2015-09-30: qty 20

## 2015-09-30 MED ORDER — MIDAZOLAM HCL 2 MG/2ML IJ SOLN
INTRAMUSCULAR | Status: DC | PRN
  Administered 2015-09-30: 1 mg via INTRAVENOUS

## 2015-09-30 MED ORDER — LIDOCAINE HCL (CARDIAC) 20 MG/ML IV SOLN
INTRAVENOUS | Status: AC
  Filled 2015-09-30: qty 5

## 2015-09-30 MED ORDER — MEPERIDINE HCL 25 MG/ML IJ SOLN: 6.2500 mg | INTRAMUSCULAR | Status: DC | PRN

## 2015-09-30 MED ORDER — FENTANYL CITRATE (PF) 100 MCG/2ML IJ SOLN
INTRAMUSCULAR | Status: AC
  Filled 2015-09-30: qty 2

## 2015-09-30 MED ORDER — BUPIVACAINE-EPINEPHRINE 0.25% -1:200000 IJ SOLN
INTRAMUSCULAR | Status: DC | PRN
  Administered 2015-09-30: 10 mL

## 2015-09-30 SURGICAL SUPPLY — 20 items
BIPOLAR CUTTING LOOP 21FR (ELECTRODE)
CANISTER SUCT 3000ML (MISCELLANEOUS) ×3 IMPLANT
CATH ROBINSON RED A/P 16FR (CATHETERS) ×3 IMPLANT
CLOTH BEACON ORANGE TIMEOUT ST (SAFETY) ×3 IMPLANT
CONTAINER PREFILL 10% NBF 60ML (FORM) ×6 IMPLANT
DEVICE MYOSURE LITE (MISCELLANEOUS) ×3 IMPLANT
ELECT REM PT RETURN 9FT ADLT (ELECTROSURGICAL)
ELECTRODE REM PT RTRN 9FT ADLT (ELECTROSURGICAL) IMPLANT
GLOVE BIOGEL PI IND STRL 7.0 (GLOVE) ×2 IMPLANT
GLOVE BIOGEL PI INDICATOR 7.0 (GLOVE) ×4
GLOVE ECLIPSE 7.0 STRL STRAW (GLOVE) ×6 IMPLANT
GOWN STRL REUS W/TWL LRG LVL3 (GOWN DISPOSABLE) ×9 IMPLANT
LOOP CUTTING BIPOLAR 21FR (ELECTRODE) IMPLANT
PACK VAGINAL MINOR WOMEN LF (CUSTOM PROCEDURE TRAY) ×3 IMPLANT
PAD OB MATERNITY 4.3X12.25 (PERSONAL CARE ITEMS) ×3 IMPLANT
SEAL ROD LENS SCOPE MYOSURE (ABLATOR) ×3 IMPLANT
TOWEL OR 17X24 6PK STRL BLUE (TOWEL DISPOSABLE) ×6 IMPLANT
TUBING AQUILEX INFLOW (TUBING) ×3 IMPLANT
TUBING AQUILEX OUTFLOW (TUBING) ×3 IMPLANT
WATER STERILE IRR 1000ML POUR (IV SOLUTION) ×3 IMPLANT

## 2015-09-30 NOTE — Op Note (Signed)
Preoperative diagnoses:  Postmenopausal bleeding  Postoperative diagnosis: Same  Procedure: D & C, hysteroscopy  Surgeon: Standley Dakins. Kennon Rounds, MD  Anesthesia: Josephine Igo, MD  Findings: large amount of clot, fluffy endometrium, nodular areas of endomtrium  Fluid deficit: 550  Estimated blood loss: Minimum  Pathology: Endometrial curettings  Indications for procedure: 65 y.o. [id number] with history of postmenopausal bleeding, who is s/p D and C for above with benign pathology who has continued to bleed since that time who presents for repeat sampling with hysteroscopy added.  Procedure: The patient was taken to the operating room where spinal analgesia was inserted. SCDs were in place.  Time out was performed. Patient was placed in dorsolithotomy in [place]. She was prepped and draped in the usual sterile fashion. A Red Rubber catheter was used to drain her bladder. A speculum was placeed in the vagina. The cervix was visualized anteriorly and grasped with a single-tooth tenaculum. Paracervical block was performed with 0.25% marcaine with epi with 10 cc injected. The uterus sounded to 7 cm. Sequential dilation was performed with Kennon Rounds dilators. The hysteroscope was inserted and the endometrial cavity and inspected. There were above findings noted in the endometrial cavity with both ostia seen.  The hysteroscope was removed. Sharp curettage was performed in all 4 quadrants. Tissue was present. Repeat hysteroscopy revealed some retained tissue. MyoSure introduced to  Better sample the cavity and sampling done in 4 quadrants. All instruments were removed from the vagina. All instrument, needle and lap counts were correct x 2. The patient was awakened and is recovering in stable condition.  [name] MD 09/30/2015 3:45 PM

## 2015-09-30 NOTE — Anesthesia Postprocedure Evaluation (Signed)
Anesthesia Post Note  Patient: Tanya Nunez  Procedure(s) Performed: Procedure(s) (LRB): DILATATION AND CURETTAGE /HYSTEROSCOPY (N/A)  Patient location during evaluation: PACU Anesthesia Type: General Level of consciousness: awake and alert and oriented Pain management: pain level controlled Vital Signs Assessment: post-procedure vital signs reviewed and stable Respiratory status: spontaneous breathing, nonlabored ventilation and respiratory function stable Cardiovascular status: blood pressure returned to baseline and stable Postop Assessment: no signs of nausea or vomiting Anesthetic complications: no     Last Vitals:  Vitals:   09/30/15 1630 09/30/15 1645  BP: 131/68 132/70  Pulse: 78 80  Resp: 17 17  Temp:  37.4 C    Last Pain: There were no vitals filed for this visit. Pain Goal: Patients Stated Pain Goal: 3 (09/30/15 1645)               Camora Tremain A.

## 2015-09-30 NOTE — Anesthesia Preprocedure Evaluation (Addendum)
Anesthesia Evaluation  Patient identified by MRN, date of birth, ID band Patient awake    Reviewed: Allergy & Precautions, NPO status , Patient's Chart, lab work & pertinent test results  Airway Mallampati: III  TM Distance: >3 FB     Dental  (+) Upper Dentures   Pulmonary neg pulmonary ROS,    Pulmonary exam normal breath sounds clear to auscultation       Cardiovascular hypertension, Pt. on medications Normal cardiovascular exam Rhythm:Regular Rate:Normal     Neuro/Psych  Headaches, negative psych ROS   GI/Hepatic negative GI ROS, Neg liver ROS,   Endo/Other  negative endocrine ROS  Renal/GU negative Renal ROS  negative genitourinary   Musculoskeletal  (+) Arthritis ,   Abdominal (+) + obese,   Peds  Hematology  (+) anemia ,   Anesthesia Other Findings   Reproductive/Obstetrics Post Menopausal bleeding                            Lab Results  Component Value Date   WBC 12.2 (H) 09/27/2015   HGB 9.2 (L) 09/27/2015   HCT 29.5 (L) 09/27/2015   MCV 100.0 09/27/2015   PLT 431 (H) 09/27/2015     Chemistry      Component Value Date/Time   NA 138 09/27/2015 1150   K 3.7 09/27/2015 1150   CL 108 09/27/2015 1150   CO2 26 09/27/2015 1150   BUN 10 09/27/2015 1150   CREATININE 0.92 09/27/2015 1150      Component Value Date/Time   CALCIUM 8.9 09/27/2015 1150      Anesthesia Physical Anesthesia Plan  ASA: II  Anesthesia Plan: General   Post-op Pain Management:    Induction: Intravenous  Airway Management Planned: LMA  Additional Equipment:   Intra-op Plan:   Post-operative Plan: Extubation in OR  Informed Consent: I have reviewed the patients History and Physical, chart, labs and discussed the procedure including the risks, benefits and alternatives for the proposed anesthesia with the patient or authorized representative who has indicated his/her understanding and  acceptance.   Dental advisory given  Plan Discussed with: Anesthesiologist, CRNA and Surgeon  Anesthesia Plan Comments:         Anesthesia Quick Evaluation

## 2015-09-30 NOTE — Transfer of Care (Signed)
Immediate Anesthesia Transfer of Care Note  Patient: Tanya Nunez  Procedure(s) Performed: Procedure(s): DILATATION AND CURETTAGE /HYSTEROSCOPY (N/A)  Patient Location: PACU  Anesthesia Type:General  Level of Consciousness: awake, alert  and oriented  Airway & Oxygen Therapy: Patient Spontanous Breathing and Patient connected to nasal cannula oxygen  Post-op Assessment: Report given to RN and Post -op Vital signs reviewed and stable  Post vital signs: Reviewed and stable  Last Vitals:  Vitals:   09/30/15 1249  BP: 130/75  Pulse: 85  Resp: (!) 22  Temp: 36.8 C    Last Pain: There were no vitals filed for this visit.    Patients Stated Pain Goal: 3 ([id number])  Complications: No apparent anesthesia complications

## 2015-09-30 NOTE — Anesthesia Procedure Notes (Signed)
Procedure Name: LMA Insertion Date/Time: 09/30/2015 3:14 PM Performed by: Jonna Munro Pre-anesthesia Checklist: Patient identified, Emergency Drugs available, Suction available, Patient being monitored and Timeout performed Patient Re-evaluated:Patient Re-evaluated prior to inductionOxygen Delivery Method: Circle system utilized Preoxygenation: Pre-oxygenation with 100% oxygen Intubation Type: IV induction LMA: LMA inserted LMA Size: 4.0 Number of attempts: 1 Placement Confirmation: positive ETCO2 and breath sounds checked- equal and bilateral Tube secured with: Tape Dental Injury: Teeth and Oropharynx as per pre-operative assessment

## 2015-09-30 NOTE — Discharge Instructions (Signed)
Hysteroscopy, Care After Refer to this sheet in the next few weeks. These instructions provide you with information on caring for yourself after your procedure. Your health care provider may also give you more specific instructions. Your treatment has been planned according to current medical practices, but problems sometimes occur. Call your health care provider if you have any problems or questions after your procedure.  WHAT TO EXPECT AFTER THE PROCEDURE After your procedure, it is typical to have the following:  You may have some cramping. This normally lasts for a couple days.  You may have bleeding. This can vary from light spotting for a few days to menstrual-like bleeding for 3-7 days. HOME CARE INSTRUCTIONS  Rest for the first 1-2 days after the procedure.  Only take over-the-counter or prescription medicines as directed by your health care provider. Do not take aspirin. It can increase the chances of bleeding.  Take showers instead of baths for 2 weeks or as directed by your health care provider.  Do not drive for 24 hours or as directed.  Do not drink alcohol while taking pain medicine.  Do not use tampons, douche, or have sexual intercourse for 2 weeks or until your health care provider says it is okay.  Take your temperature twice a day for 4-5 days. Write it down each time.  Follow your health care provider's advice about diet, exercise, and lifting.  If you develop constipation, you may:  Take a mild laxative if your health care provider approves.  Add bran foods to your diet.  Drink enough fluids to keep your urine clear or pale yellow.  Try to have someone with you or available to you for the first 24-48 hours, especially if you were given a general anesthetic.  Follow up with your health care provider as directed. SEEK MEDICAL CARE IF:  You feel dizzy or lightheaded.  You feel sick to your stomach (nauseous).  You have abnormal vaginal discharge.  You  have a rash.  You have pain that is not controlled with medicine. SEEK IMMEDIATE MEDICAL CARE IF:  You have bleeding that is heavier than a normal menstrual period.  You have a fever.  You have increasing cramps or pain, not controlled with medicine.  You have new belly (abdominal) pain.  You pass out.  You have pain in the tops of your shoulders (shoulder strap areas).  You have shortness of breath.   This information is not intended to replace advice given to you by your health care provider. Make sure you discuss any questions you have with your health care provider.   Document Released: 10/23/2012 Document Reviewed: 10/23/2012 Elsevier Interactive Patient Education [employer].

## 2015-09-30 NOTE — Interval H&P Note (Signed)
History and Physical Interval Note:  09/30/2015 3:01 PM  Tanya Nunez  has presented today for surgery, with the diagnosis of Postmenopausal bleeding  The various methods of treatment have been discussed with the patient and family. After consideration of risks, benefits and other options for treatment, the patient has consented to  Procedure(s): DILATATION AND CURETTAGE /HYSTEROSCOPY (N/A) as a surgical intervention .  The patient's history has been reviewed, patient examined, no change in status, stable for surgery.  I have reviewed the patient's chart and labs.  Questions were answered to the patient's satisfaction.     [name]

## 2015-10-03 ENCOUNTER — Encounter (HOSPITAL_COMMUNITY): Payer: Self-pay | Admitting: Family Medicine

## 2015-10-07 ENCOUNTER — Telehealth: Payer: Self-pay | Admitting: General Practice

## 2015-10-07 NOTE — Telephone Encounter (Signed)
Per Dr Kennon Rounds, patient's bx was negative- we need to find out if patient is still bleeding. Called patient, no answer- left message stating we are trying to reach you with non urgent results, please call us back

## 2015-10-08 NOTE — Telephone Encounter (Signed)
Ca[place] called 10/07/15  5:38pm and left a message that she is returning our call.  States she knows she has an appointment Thursday at 1;40.    I called [place] and left a message that we are trying to give  some information to her and get some information. Please call us Monday and ask to speak to a nurse - if nurses are tied up leave a message - if we may leave detailed information on your voicemail- please let us know that.

## 2015-10-14 ENCOUNTER — Ambulatory Visit: Payer: Medicare Other | Admitting: Family Medicine

## 2015-10-15 ENCOUNTER — Encounter: Payer: Self-pay | Admitting: Family Medicine

## 2015-10-15 ENCOUNTER — Ambulatory Visit (INDEPENDENT_AMBULATORY_CARE_PROVIDER_SITE_OTHER): Payer: Medicare Other | Admitting: Family Medicine

## 2015-10-15 VITALS — BP 124/70 | HR 91 | Wt 194.8 lb

## 2015-10-15 DIAGNOSIS — N950 Postmenopausal bleeding: Secondary | ICD-10-CM

## 2015-10-15 DIAGNOSIS — R938 Abnormal findings on diagnostic imaging of other specified body structures: Secondary | ICD-10-CM | POA: Diagnosis not present

## 2015-10-15 DIAGNOSIS — R9389 Abnormal findings on diagnostic imaging of other specified body structures: Secondary | ICD-10-CM

## 2015-10-15 DIAGNOSIS — J309 Allergic rhinitis, unspecified: Secondary | ICD-10-CM | POA: Insufficient documentation

## 2015-10-15 DIAGNOSIS — I10 Essential (primary) hypertension: Secondary | ICD-10-CM | POA: Insufficient documentation

## 2015-10-15 NOTE — Patient Instructions (Addendum)
Iron Deficiency Anemia, Adult Anemia is a condition in which there are less red blood cells or hemoglobin in the blood than normal. Hemoglobin is the part of red blood cells that carries oxygen. Iron deficiency anemia is anemia caused by too little iron. It is the most common type of anemia. It may leave you tired and short of breath. CAUSES   Lack of iron in the diet.  Poor absorption of iron, as seen with intestinal disorders.  Intestinal bleeding.  Heavy periods. SIGNS AND SYMPTOMS  Mild anemia may not be noticeable. Symptoms may include:  Fatigue.  Headache.  Pale skin.  Weakness.  Tiredness.  Shortness of breath.  Dizziness.  Cold hands and feet.  Fast or irregular heartbeat. DIAGNOSIS  Diagnosis requires a thorough evaluation and physical exam by your health care provider. Blood tests are generally used to confirm iron deficiency anemia. Additional tests may be done to find the underlying cause of your anemia. These may include:  Testing for blood in the stool (fecal occult blood test).  A procedure to see inside the colon and rectum (colonoscopy).  A procedure to see inside the esophagus and stomach (endoscopy). TREATMENT  Iron deficiency anemia is treated by correcting the cause of the deficiency. Treatment may involve:  Adding iron-rich foods to your diet.  Taking iron supplements. Pregnant or breastfeeding women need to take extra iron because their normal diet usually does not provide the required amount.  Taking vitamins. Vitamin C improves the absorption of iron. Your health care provider may recommend that you take your iron tablets with a glass of orange juice or vitamin C supplement.  Medicines to make heavy menstrual flow lighter.  Surgery. HOME CARE INSTRUCTIONS   Take iron as directed by your health care provider.  If you cannot tolerate taking iron supplements by mouth, talk to your health care provider about taking them through a vein  (intravenously) or an injection into a muscle.  For the best iron absorption, iron supplements should be taken on an empty stomach. If you cannot tolerate them on an empty stomach, you may need to take them with food.  Do not drink milk or take antacids at the same time as your iron supplements. Milk and antacids may interfere with the absorption of iron.  Iron supplements can cause constipation. Make sure to include fiber in your diet to prevent constipation. A stool softener may also be recommended.  Take vitamins as directed by your health care provider.  Eat a diet rich in iron. Foods high in iron include liver, lean beef, whole-grain bread, eggs, dried fruit, and dark green leafy vegetables. SEEK IMMEDIATE MEDICAL CARE IF:   You faint. If this happens, do not drive. Call your local emergency services (911 in [place] if no other help is available.  You have chest pain.  You feel nauseous or vomit.  You have severe or increased shortness of breath with activity.  You feel weak.  You have a rapid heartbeat.  You have unexplained sweating.  You become light-headed when getting up from a chair or bed. MAKE SURE YOU:   Understand these instructions.  Will watch your condition.  Will get help right away if you are not doing well or get worse.   This information is not intended to replace advice given to you by your health care provider. Make sure you discuss any questions you have with your health care provider.   Document Released: 12/31/1999 Document Revised: 01/23/2014 Document Reviewed: 09/09/2012 Elsevier   Interactive Patient Education 2016 [employer] Inc[employer]  

## 2015-10-15 NOTE — Assessment & Plan Note (Signed)
Given benign path x 2 and no further bleeding will stop work-up at this stage. Ok to use iron as she was anemic. If bleeding is persistent, consider TVH.

## 2015-10-15 NOTE — Progress Notes (Signed)
   Subjective:    Patient ID: Tanya Nunez is a 65 y.o. female presenting with Follow-up  on 10/15/2015  HPI: Patient is now s/p D and C with Hysteroscopy. Pathology is consistent with benign polyp and decidua with breakdown, no evidence of hyperplasia or malignancy. Patient has stopped bleeding is no longer on Megace.   Review of Systems  Constitutional: Negative for chills and fever.  Respiratory: Negative for shortness of breath.   Cardiovascular: Negative for chest pain.  Gastrointestinal: Negative for abdominal pain, nausea and vomiting.  Genitourinary: Negative for dysuria.  Skin: Negative for rash.      Objective:    BP 124/70   Pulse 91   Wt 194 lb 12.8 oz (88.4 kg)   BMI 33.44 kg/m  Physical Exam  Constitutional: She is oriented to person, place, and time. She appears well-developed and well-nourished. No distress.  HENT:  Head: Normocephalic and atraumatic.  Eyes: No scleral icterus.  Neck: Neck supple.  Cardiovascular: Normal rate.   Pulmonary/Chest: Effort normal.  Abdominal: Soft.  Neurological: She is alert and oriented to person, place, and time.  Skin: Skin is warm and dry.  Psychiatric: She has a normal mood and affect.        Assessment & Plan:   Problem List Items Addressed This Visit      Unprioritized   PMB (postmenopausal bleeding) - Primary    Given benign path x 2 and no further bleeding will stop work-up at this stage. Ok to use iron as she was anemic. If bleeding is persistent, consider TVH.      Thickened endometrium    Other Visit Diagnoses   None.     Total face-to-face time with patient: 15 minutes. Over 50% of encounter was spent on counseling and coordination of care. Return in about 3 months (around 01/14/2016) for a follow-up.  [name] 10/15/2015 9:50 AM

## 2015-10-25 ENCOUNTER — Ambulatory Visit: Payer: Medicare Other | Admitting: Family Medicine

## 2016-01-13 ENCOUNTER — Ambulatory Visit (INDEPENDENT_AMBULATORY_CARE_PROVIDER_SITE_OTHER): Payer: Medicare Other | Admitting: Family Medicine

## 2016-01-13 ENCOUNTER — Encounter: Payer: Self-pay | Admitting: Family Medicine

## 2016-01-13 VITALS — BP 139/82 | HR 85 | Ht 63.0 in | Wt 198.0 lb

## 2016-01-13 DIAGNOSIS — M79605 Pain in left leg: Secondary | ICD-10-CM | POA: Diagnosis not present

## 2016-01-13 DIAGNOSIS — M79604 Pain in right leg: Secondary | ICD-10-CM

## 2016-01-13 DIAGNOSIS — Z23 Encounter for immunization: Secondary | ICD-10-CM | POA: Diagnosis not present

## 2016-01-13 DIAGNOSIS — Z1231 Encounter for screening mammogram for malignant neoplasm of breast: Secondary | ICD-10-CM

## 2016-01-13 DIAGNOSIS — R300 Dysuria: Secondary | ICD-10-CM

## 2016-01-13 LAB — POCT URINALYSIS DIP (DEVICE)
BILIRUBIN URINE: NEGATIVE
GLUCOSE, UA: NEGATIVE mg/dL
Ketones, ur: NEGATIVE mg/dL
Nitrite: NEGATIVE
Protein, ur: NEGATIVE mg/dL
SPECIFIC GRAVITY, URINE: 1.015 (ref 1.005–1.030)
Urobilinogen, UA: 0.2 mg/dL (ref 0.0–1.0)
pH: 5 (ref 5.0–8.0)

## 2016-01-13 MED ORDER — CYCLOBENZAPRINE HCL 10 MG PO TABS: 10.0000 mg | ORAL_TABLET | Freq: Three times a day (TID) | ORAL | 1 refills | Status: AC | PRN

## 2016-01-13 NOTE — Patient Instructions (Signed)
Advance Directive Advance directives are the legal documents that allow you to make choices about your health care and medical treatment if you cannot speak for yourself. Advance directives are a way for you to communicate your wishes to family, friends, and health care providers. The specified people can then convey your decisions about end-of-life care to avoid confusion if you should become unable to communicate. Ideally, the process of discussing and writing advance directives should happen over time rather than making decisions all at once. Advance directives can be modified as your situation changes, and you can change your mind at any time, even after you have signed the advance directives. Each state has its own laws regarding advance directives. You may want to check with your health care provider, attorney, or state representative about the law in your state. Below are some examples of advance directives. HEALTH CARE PROXY AND DURABLE POWER OF ATTORNEY FOR HEALTH CARE A health care proxy is a person (agent) appointed to make medical decisions for you if you cannot. Generally, people choose someone they know well and trust to represent their preferences when they can no longer do so. You should be sure to ask this person for agreement to act as your agent. An agent may have to exercise judgment in the event of a medical decision for which your wishes are not known. A durable power of attorney for health care is a legal document that names your health care proxy. Depending on the laws in your state, after the document is written, it may also need to be:  Signed.  Notarized.  Dated.  Copied.  Witnessed.  Incorporated into your medical record. You may also want to appoint someone to manage your financial affairs if you cannot. This is called a durable power of attorney for finances. It is a separate legal document from the durable power of attorney for health care. You may choose the same  person or someone different from your health care proxy to act as your agent in financial matters. LIVING WILL A living will is a set of instructions documenting your wishes about medical care when you cannot care for yourself. It is used if you become:  Terminally ill.  Incapacitated.  Unable to communicate.  Unable to make decisions. Items to consider in your living will include:  The use or non-use of life-sustaining equipment, such as dialysis machines and breathing machines (ventilators).  A do not resuscitate (DNR) order, which is the instruction not to use cardiopulmonary resuscitation (CPR) if breathing or heartbeat stops.  Tube feeding.  Withholding of food and fluids.  Comfort (palliative) care when the goal becomes comfort rather than a cure.  Organ and tissue donation. A living will does not give instructions about distribution of your money and property if you should pass away. It is advisable to seek the expert advice of a lawyer in drawing up a will regarding your possessions. Decisions about taxes, beneficiaries, and asset distribution will be legally binding. This process can relieve your family and friends of any burdens surrounding disputes or questions that may come up about the allocation of your assets. DO NOT RESUSCITATE (DNR) A do not resuscitate (DNR) order is a request to not have CPR in the event that your heart stops beating or you stop breathing. Unless given other instructions, a health care provider will try to help any patient whose heart has stopped or who has stopped breathing.  This information is not intended to replace advice given to  you by your health care provider. Make sure you discuss any questions you have with your health care provider. Document Released: 04/11/2007 Document Revised: 04/26/2015 Document Reviewed: 05/22/2012 Elsevier Interactive Patient Education  2017 Elsevier Inc.  

## 2016-01-13 NOTE — Progress Notes (Signed)
   Subjective:    Patient ID: Tanya Nunez is a 65 y.o. female presenting with Follow-up  on 01/13/2016  HPI: Here for f/u. Previously with PMB. She has not had further bleeding since second D and C with hysteroscopy. Reports pain in legs. Previously on Ultram. Taking Naproxen. Reports mild dysuria.  Review of Systems  Constitutional: Negative for chills and fever.  Respiratory: Negative for shortness of breath.   Cardiovascular: Negative for chest pain.  Gastrointestinal: Negative for abdominal pain, nausea and vomiting.  Genitourinary: Negative for dysuria.  Skin: Negative for rash.      Objective:    BP 139/82   Pulse 85   Ht 5\' 3"  (1.6 m)   Wt 198 lb (89.8 kg)   BMI 35.07 kg/m    Physical Exam  Constitutional: She is oriented to person, place, and time. She appears well-developed and well-nourished. No distress.  HENT:  Head: Normocephalic and atraumatic.  Eyes: No scleral icterus.  Neck: Neck supple.  Cardiovascular: Normal rate.   Pulmonary/Chest: Effort normal.  Abdominal: Soft.  Neurological: She is alert and oriented to person, place, and time.  Skin: Skin is warm and dry.  Psychiatric: She has a normal mood and affect.   Urinalysis    Component Value Date/Time   LABSPEC 1.015 01/13/2016 1000   PHURINE 5.0 01/13/2016 1000   GLUCOSEU NEGATIVE 01/13/2016 1000   HGBUR TRACE (A) 01/13/2016 1000   BILIRUBINUR NEGATIVE 01/13/2016 1000   KETONESUR NEGATIVE 01/13/2016 1000   PROTEINUR NEGATIVE 01/13/2016 1000   UROBILINOGEN 0.2 01/13/2016 1000   NITRITE NEGATIVE 01/13/2016 1000   LEUKOCYTESUR SMALL (A) 01/13/2016 1000       Assessment & Plan:   Problem List Items Addressed This Visit    None    Visit Diagnoses    Visit for screening mammogram    -  Primary   Relevant Orders   MM Digital Screening   Dysuria       Relevant Orders   POCT urinalysis dip (device) (Completed)   Urine culture   Leg pain, bilateral       Relevant Medications   cyclobenzaprine (FLEXERIL) 10 MG tablet    Check urine culture.  Total face-to-face time with patient: 15 minutes. Over 50% of encounter was spent on counseling and coordination of care. Return in about 6 months (around 07/13/2016) for a follow-up, CPE. See PCP for further management of her leg pain. [name] 01/13/2016 9:43 AM

## 2016-01-13 NOTE — Addendum Note (Signed)
Addended by: Shelly Coss on: 01/13/2016 10:21 AM   Modules accepted: Orders

## 2016-01-15 LAB — URINE CULTURE

## 2016-01-20 ENCOUNTER — Telehealth: Payer: Self-pay

## 2016-01-20 MED ORDER — SULFAMETHOXAZOLE-TRIMETHOPRIM 800-160 MG PO TABS: 1.0000 | ORAL_TABLET | Freq: Two times a day (BID) | ORAL | 0 refills | Status: DC

## 2016-01-20 NOTE — Telephone Encounter (Signed)
Patient has been notified of test results. Septra called into [facility] on [address]. Patient reports understanding at this time.

## 2016-02-02 ENCOUNTER — Ambulatory Visit: Payer: Medicare Other

## 2016-02-15 ENCOUNTER — Ambulatory Visit
Admission: RE | Admit: 2016-02-15 | Discharge: 2016-02-15 | Disposition: A | Discharge status: Home / Self Care | Payer: Medicare Other | Source: Ambulatory Visit | Attending: Family Medicine | Admitting: Family Medicine

## 2016-02-15 DIAGNOSIS — Z1231 Encounter for screening mammogram for malignant neoplasm of breast: Secondary | ICD-10-CM

## 2016-02-17 DIAGNOSIS — I10 Essential (primary) hypertension: Secondary | ICD-10-CM | POA: Diagnosis not present

## 2016-02-17 DIAGNOSIS — Z131 Encounter for screening for diabetes mellitus: Secondary | ICD-10-CM | POA: Diagnosis not present

## 2016-02-17 DIAGNOSIS — E669 Obesity, unspecified: Secondary | ICD-10-CM | POA: Diagnosis not present

## 2016-02-17 DIAGNOSIS — R011 Cardiac murmur, unspecified: Secondary | ICD-10-CM | POA: Diagnosis not present

## 2016-02-28 DIAGNOSIS — I10 Essential (primary) hypertension: Secondary | ICD-10-CM | POA: Diagnosis not present

## 2016-02-28 DIAGNOSIS — E785 Hyperlipidemia, unspecified: Secondary | ICD-10-CM | POA: Diagnosis not present

## 2016-02-28 DIAGNOSIS — R011 Cardiac murmur, unspecified: Secondary | ICD-10-CM | POA: Diagnosis not present

## 2016-02-28 DIAGNOSIS — N289 Disorder of kidney and ureter, unspecified: Secondary | ICD-10-CM | POA: Diagnosis not present

## 2016-02-28 DIAGNOSIS — E559 Vitamin D deficiency, unspecified: Secondary | ICD-10-CM | POA: Diagnosis not present

## 2016-02-28 DIAGNOSIS — E669 Obesity, unspecified: Secondary | ICD-10-CM | POA: Diagnosis not present

## 2016-03-10 DIAGNOSIS — R011 Cardiac murmur, unspecified: Secondary | ICD-10-CM | POA: Diagnosis not present

## 2016-03-10 DIAGNOSIS — I10 Essential (primary) hypertension: Secondary | ICD-10-CM | POA: Diagnosis not present

## 2016-03-30 DIAGNOSIS — N289 Disorder of kidney and ureter, unspecified: Secondary | ICD-10-CM | POA: Diagnosis not present

## 2016-03-30 DIAGNOSIS — E669 Obesity, unspecified: Secondary | ICD-10-CM | POA: Diagnosis not present

## 2016-03-30 DIAGNOSIS — E785 Hyperlipidemia, unspecified: Secondary | ICD-10-CM | POA: Diagnosis not present

## 2016-03-30 DIAGNOSIS — E559 Vitamin D deficiency, unspecified: Secondary | ICD-10-CM | POA: Diagnosis not present

## 2016-03-30 DIAGNOSIS — R011 Cardiac murmur, unspecified: Secondary | ICD-10-CM | POA: Diagnosis not present

## 2016-03-30 DIAGNOSIS — I10 Essential (primary) hypertension: Secondary | ICD-10-CM | POA: Diagnosis not present

## 2016-04-24 DIAGNOSIS — M25511 Pain in right shoulder: Secondary | ICD-10-CM | POA: Diagnosis not present

## 2016-04-24 DIAGNOSIS — N289 Disorder of kidney and ureter, unspecified: Secondary | ICD-10-CM | POA: Diagnosis not present

## 2016-04-24 DIAGNOSIS — E785 Hyperlipidemia, unspecified: Secondary | ICD-10-CM | POA: Diagnosis not present

## 2016-04-24 DIAGNOSIS — E669 Obesity, unspecified: Secondary | ICD-10-CM | POA: Diagnosis not present

## 2016-04-24 DIAGNOSIS — E559 Vitamin D deficiency, unspecified: Secondary | ICD-10-CM | POA: Diagnosis not present

## 2016-04-24 DIAGNOSIS — I10 Essential (primary) hypertension: Secondary | ICD-10-CM | POA: Diagnosis not present

## 2016-05-29 DIAGNOSIS — Z0000 Encounter for general adult medical examination without abnormal findings: Secondary | ICD-10-CM | POA: Diagnosis not present

## 2016-05-29 DIAGNOSIS — E669 Obesity, unspecified: Secondary | ICD-10-CM | POA: Diagnosis not present

## 2016-05-29 DIAGNOSIS — R062 Wheezing: Secondary | ICD-10-CM | POA: Diagnosis not present

## 2016-05-29 DIAGNOSIS — E559 Vitamin D deficiency, unspecified: Secondary | ICD-10-CM | POA: Diagnosis not present

## 2016-05-29 DIAGNOSIS — I10 Essential (primary) hypertension: Secondary | ICD-10-CM | POA: Diagnosis not present

## 2016-05-29 DIAGNOSIS — M25511 Pain in right shoulder: Secondary | ICD-10-CM | POA: Diagnosis not present

## 2016-05-29 DIAGNOSIS — N289 Disorder of kidney and ureter, unspecified: Secondary | ICD-10-CM | POA: Diagnosis not present

## 2016-05-29 DIAGNOSIS — Z1389 Encounter for screening for other disorder: Secondary | ICD-10-CM | POA: Diagnosis not present

## 2016-05-29 DIAGNOSIS — E785 Hyperlipidemia, unspecified: Secondary | ICD-10-CM | POA: Diagnosis not present

## 2016-06-06 DIAGNOSIS — M545 Low back pain: Secondary | ICD-10-CM | POA: Diagnosis not present

## 2016-06-06 DIAGNOSIS — E785 Hyperlipidemia, unspecified: Secondary | ICD-10-CM | POA: Diagnosis not present

## 2016-06-06 DIAGNOSIS — Z1389 Encounter for screening for other disorder: Secondary | ICD-10-CM | POA: Diagnosis not present

## 2016-06-06 DIAGNOSIS — R109 Unspecified abdominal pain: Secondary | ICD-10-CM | POA: Diagnosis not present

## 2016-06-06 DIAGNOSIS — E559 Vitamin D deficiency, unspecified: Secondary | ICD-10-CM | POA: Diagnosis not present

## 2016-06-06 DIAGNOSIS — I10 Essential (primary) hypertension: Secondary | ICD-10-CM | POA: Diagnosis not present

## 2016-06-06 DIAGNOSIS — N289 Disorder of kidney and ureter, unspecified: Secondary | ICD-10-CM | POA: Diagnosis not present

## 2016-06-06 DIAGNOSIS — E669 Obesity, unspecified: Secondary | ICD-10-CM | POA: Diagnosis not present

## 2016-06-06 DIAGNOSIS — R062 Wheezing: Secondary | ICD-10-CM | POA: Diagnosis not present

## 2016-06-07 DIAGNOSIS — M19011 Primary osteoarthritis, right shoulder: Secondary | ICD-10-CM | POA: Diagnosis not present

## 2016-06-07 DIAGNOSIS — M7541 Impingement syndrome of right shoulder: Secondary | ICD-10-CM | POA: Diagnosis not present

## 2016-06-07 DIAGNOSIS — G8929 Other chronic pain: Secondary | ICD-10-CM | POA: Diagnosis not present

## 2016-06-07 DIAGNOSIS — M25511 Pain in right shoulder: Secondary | ICD-10-CM | POA: Diagnosis not present

## 2016-06-10 DIAGNOSIS — M4696 Unspecified inflammatory spondylopathy, lumbar region: Secondary | ICD-10-CM | POA: Diagnosis not present

## 2016-06-10 DIAGNOSIS — M545 Low back pain: Secondary | ICD-10-CM | POA: Diagnosis not present

## 2016-06-15 DIAGNOSIS — M545 Low back pain: Secondary | ICD-10-CM | POA: Diagnosis not present

## 2016-06-16 DIAGNOSIS — M6281 Muscle weakness (generalized): Secondary | ICD-10-CM | POA: Diagnosis not present

## 2016-06-16 DIAGNOSIS — M25511 Pain in right shoulder: Secondary | ICD-10-CM | POA: Diagnosis not present

## 2016-06-26 ENCOUNTER — Telehealth: Payer: Self-pay | Admitting: Family Medicine

## 2016-06-26 NOTE — Telephone Encounter (Signed)
Patient would like a call from the nurse due to still bleeding. She has questions.

## 2016-07-14 ENCOUNTER — Encounter: Payer: Self-pay | Admitting: Family Medicine

## 2016-07-14 ENCOUNTER — Ambulatory Visit (INDEPENDENT_AMBULATORY_CARE_PROVIDER_SITE_OTHER): Payer: Medicare Other | Admitting: Family Medicine

## 2016-07-14 VITALS — BP 132/77 | HR 75 | Ht 64.0 in | Wt 191.7 lb

## 2016-07-14 DIAGNOSIS — R9389 Abnormal findings on diagnostic imaging of other specified body structures: Secondary | ICD-10-CM

## 2016-07-14 DIAGNOSIS — R938 Abnormal findings on diagnostic imaging of other specified body structures: Secondary | ICD-10-CM | POA: Diagnosis not present

## 2016-07-14 DIAGNOSIS — N950 Postmenopausal bleeding: Secondary | ICD-10-CM | POA: Diagnosis present

## 2016-07-14 NOTE — Patient Instructions (Addendum)
Hysterectomy Information A hysterectomy is a surgery to remove your uterus. After surgery, you will no longer have periods. Also, you will not be able to get pregnant. Reasons for this surgery  You have bleeding that is not normal and keeps coming back.  You have lasting (chronic) lower belly (pelvic) pain.  You have a lasting infection.  The lining of your uterus grows outside your uterus.  The lining of your uterus grows in the muscle of your uterus.  Your uterus falls down into your vagina.  You have a growth in your uterus that causes problems.  You have cells that could turn into cancer (precancerous cells).  You have cancer of the uterus or cervix. Types There are 3 types of hysterectomies. Depending on the type, the surgery will:  Remove the top part of the uterus only.  Remove the uterus and the cervix.  Remove the uterus, cervix, and tissue that holds the uterus in place in the lower belly.  Ways a hysterectomy can be performed There are 5 ways this surgery can be performed.  A cut (incision) is made in the belly (abdomen). The uterus is taken out through the cut.  A cut is made in the vagina. The uterus is taken out through the cut.  Three or four cuts are made in the belly. A surgical device with a camera is put through one of the cuts. The uterus is cut into small pieces. The uterus is taken out through the cuts or the vagina.  Three or four cuts are made in the belly. A surgical device with a camera is put through one of the cuts. The uterus is taken out through the vagina.  Three or four cuts are made in the belly. A surgical device that is controlled by a computer makes a visual image. The device helps the surgeon control the surgical tools. The uterus is cut into small pieces. The pieces are taken out through the cuts or through the vagina.  What can I expect after the surgery?  You will be given pain medicine.  You will need help at home for 3-5 days  after surgery.  You will need to see your doctor in 2-4 weeks after surgery.  You may get hot flashes, have night sweats, and have trouble sleeping.  You may need to have Pap tests in the future if your surgery was related to cancer. Talk to your doctor. It is still good to have regular exams. This information is not intended to replace advice given to you by your health care provider. Make sure you discuss any questions you have with your health care provider. Document Released: 03/27/2011 Document Revised: 06/10/2015 Document Reviewed: 09/09/2012 Elsevier Interactive Patient Education  2018 Elsevier Inc.  

## 2016-07-14 NOTE — Progress Notes (Signed)
   Subjective:    Patient ID: Tanya Nunez is a 66 y.o. [id number]  female presenting with Gynecologic Exam  on 07/14/2016  HPI: Here to f/u postmenopausal bleeding. She is s/p D and C with hysteroscopy x 2 and EMB all with benign findings. No bleeding since last D and C in September of 2017 until 3 wks ago. Bleeding now x 3 wks. Not heavy cycle but continues to bleed. Has h/o SVD x 4.  Review of Systems  Constitutional: Negative for chills and fever.  Respiratory: Negative for shortness of breath.   Cardiovascular: Negative for chest pain.  Gastrointestinal: Negative for abdominal pain, nausea and vomiting.  Genitourinary: Negative for dysuria.  Skin: Negative for rash.      Objective:    BP 132/77   Pulse 75   Ht 5\' 4"  (1.626 m)   Wt 191 lb 11.2 oz (87 kg)   BMI 32.91 kg/m  Physical Exam  Constitutional: She is oriented to person, place, and time. She appears well-developed and well-nourished. No distress.  HENT:  Head: Normocephalic and atraumatic.  Eyes: No scleral icterus.  Neck: Neck supple.  Cardiovascular: Normal rate.   Pulmonary/Chest: Effort normal.  Abdominal: Soft.  Neurological: She is alert and oriented to person, place, and time.  Skin: Skin is warm and dry.  Psychiatric: She has a normal mood and affect.        Assessment & Plan:   Problem List Items Addressed This Visit      Unprioritized   PMB (postmenopausal bleeding) - Primary    All options reviewed at length with patient. She has previously tried megace without help in fixing her bleeding. Additionally, has had IUD in the past, and declines this or any further attempts at getting bleeding to stop short of hysterectomy. Will proceed with TVH and probably BSO. Denies DOE, SOB or chest pain with exertion. All risks of surgery reviewed at length. Risks include but are not limited to bleeding, infection, injury to surrounding structures, including bowel, bladder and ureters, blood clots, and death and  anesthetic risks.  Likelihood of success is high.       Thickened endometrium    Still a chance that there is something in there but we have sampled her at length prior to surgery. Definitive treatment will allow full pathological assessment.         Total face-to-face time with patient: 25 minutes. Over 50% of encounter was spent on counseling and coordination of care.  [name] 07/14/2016 10:55 AM

## 2016-07-15 NOTE — Assessment & Plan Note (Signed)
Still a chance that there is something in there but we have sampled her at length prior to surgery. Definitive treatment will allow full pathological assessment.

## 2016-07-15 NOTE — Assessment & Plan Note (Signed)
All options reviewed at length with patient. She has previously tried megace without help in fixing her bleeding. Additionally, has had IUD in the past, and declines this or any further attempts at getting bleeding to stop short of hysterectomy. Will proceed with TVH and probably BSO. Denies DOE, SOB or chest pain with exertion. All risks of surgery reviewed at length. Risks include but are not limited to bleeding, infection, injury to surrounding structures, including bowel, bladder and ureters, blood clots, and death and anesthetic risks.  Likelihood of success is high.

## 2016-07-31 ENCOUNTER — Encounter (HOSPITAL_COMMUNITY): Payer: Self-pay

## 2016-08-03 DIAGNOSIS — I10 Essential (primary) hypertension: Secondary | ICD-10-CM | POA: Diagnosis not present

## 2016-08-03 DIAGNOSIS — E559 Vitamin D deficiency, unspecified: Secondary | ICD-10-CM | POA: Diagnosis not present

## 2016-08-03 DIAGNOSIS — N289 Disorder of kidney and ureter, unspecified: Secondary | ICD-10-CM | POA: Diagnosis not present

## 2016-08-03 DIAGNOSIS — E669 Obesity, unspecified: Secondary | ICD-10-CM | POA: Diagnosis not present

## 2016-08-03 DIAGNOSIS — E785 Hyperlipidemia, unspecified: Secondary | ICD-10-CM | POA: Diagnosis not present

## 2016-09-19 NOTE — H&P (Signed)
Tanya Nunez is an 66 y.o. [id number] female.   Chief Complaint: Postmenopausal bleeding HPI: She is s/p D and C with hysteroscopy x 2 and EMB all with benign findings. No bleeding since last D and C in September of 2017 until 3 wks ago. Bleeding now x 3 wks. Not heavy cycle but continues to bleed. Has h/o SVD x 4. Desires definitive therpay.  Past Medical History:  Diagnosis Date  . Arthritis    hands, knees - no meds  . Headache    history of Migraines  . Hypertension   . Irregular heart beats   . SVD (spontaneous vaginal delivery)    x 4    Past Surgical History:  Procedure Laterality Date  . DILATION AND CURETTAGE OF UTERUS  2012  . DILATION AND CURETTAGE OF UTERUS N/A 08/31/2015   Procedure: DILATATION AND CURETTAGE;  Surgeon: Emily Filbert, MD;  Location: [facility];  Service: Gynecology;  Laterality: N/A;  . HYSTEROSCOPY W/D&C N/A 09/30/2015   Procedure: DILATATION AND CURETTAGE /HYSTEROSCOPY;  Surgeon: [name], MD;  Location: [facility];  Service: Gynecology;  Laterality: N/A;  . TUBAL LIGATION      Family History  Problem Relation Age of Onset  . Colonic polyp Unknown    Social History:  reports that she has never smoked. She has never used smokeless tobacco. She reports that she does not drink alcohol or use drugs.  Allergies: No Known Allergies  No current facility-administered medications on file prior to encounter.    Current Outpatient Prescriptions on File Prior to Encounter  Medication Sig Dispense Refill  . aspirin EC 81 MG tablet Take 81 mg by mouth.    . cyclobenzaprine (FLEXERIL) 10 MG tablet Take 1 tablet (10 mg total) by mouth every 8 (eight) hours as needed for muscle spasms. 30 tablet 1  . hydrochlorothiazide (HYDRODIURIL) 25 MG tablet Take 25 mg by mouth daily.    Marland Kitchen loratadine (CLARITIN) 10 MG tablet Take 10 mg by mouth daily as needed for allergies.    Marland Kitchen losartan (COZAAR) 50 MG tablet Take 50 mg by mouth.    . losartan (COZAAR) 50 MG tablet Take 50 mg by  mouth.    . naproxen (NAPROSYN) 500 MG tablet Take 500 mg by mouth 2 (two) times daily as needed for mild pain.     Marland Kitchen VITAMIN D, CHOLECALCIFEROL, PO Take 1 tablet by mouth daily.      A comprehensive review of systems was negative.  There were no vitals taken for this visit. General appearance: alert, cooperative and appears stated age Head: Normocephalic, without obvious abnormality, atraumatic Neck: supple, symmetrical, trachea midline Lungs: normal effort Heart: regular rate and rhythm Abdomen: soft, non-tender; bowel sounds normal; no masses,  no organomegaly Extremities: extremities normal, atraumatic, no cyanosis or edema Skin: Skin color, texture, turgor normal. No rashes or lesions Neurologic: Grossly normal   Lab Results  Component Value Date   WBC 12.2 (H) 09/27/2015   HGB 9.2 (L) 09/27/2015   HCT 29.5 (L) 09/27/2015   MCV 100.0 09/27/2015   PLT 431 (H) 09/27/2015    Assessment/Plan Principal Problem:   PMB (postmenopausal bleeding) Active Problems:   Thickened endometrium  For TVH with BSO Risks include but are not limited to bleeding, infection, injury to surrounding structures, including bowel, bladder and ureters, blood clots, and death.  Likelihood of success is high.    [name] 09/19/2016, 12:52 PM

## 2016-09-20 NOTE — Patient Instructions (Addendum)
Your procedure is scheduled on:  Tuesday, Sept. 11, 2018  Enter through the [employer] [facility] at:  10:45 AM  Pick up the phone at the desk and dial [phone].  Call this number if you have problems the morning of surgery: [phone].  Remember: Do NOT eat food:  After Midnight Monday  Do NOT drink clear liquids after:  6:00 AM Tuesday  Take these medicines the morning of surgery with a SIP OF WATER:  Amlodipine   Stop ALL herbal medications at this time  Do NOT smoke the day of surgery.  Do NOT wear jewelry (body piercing), metal hair clips/bobby pins, make-up, artifical eyelashes or nail polish. Do NOT wear lotions, powders, or perfumes.  You may wear deodorant. Do NOT shave for 48 hours prior to surgery. Do NOT bring valuables to the hospital. Contacts, dentures, or bridgework may not be worn into surgery.  Leave suitcase in car.  After surgery it may be brought to your room.  For patients admitted to the hospital, checkout time is 11:00 AM the day of discharge.  Bring a copy of your healthcare power of attorney and living will documents.

## 2016-09-21 ENCOUNTER — Other Ambulatory Visit: Payer: Self-pay

## 2016-09-21 ENCOUNTER — Encounter (HOSPITAL_COMMUNITY): Payer: Self-pay

## 2016-09-21 ENCOUNTER — Encounter (HOSPITAL_COMMUNITY)
Admission: RE | Admit: 2016-09-21 | Discharge: 2016-09-21 | Disposition: A | Discharge status: Home / Self Care | Payer: Medicare Other | Source: Ambulatory Visit | Attending: Family Medicine | Admitting: Family Medicine

## 2016-09-21 DIAGNOSIS — Z01810 Encounter for preprocedural cardiovascular examination: Secondary | ICD-10-CM | POA: Insufficient documentation

## 2016-09-21 DIAGNOSIS — Z01812 Encounter for preprocedural laboratory examination: Secondary | ICD-10-CM | POA: Insufficient documentation

## 2016-09-21 HISTORY — DX: Cardiac murmur, unspecified: R01.1

## 2016-09-21 HISTORY — DX: Pain in right shoulder: M25.511

## 2016-09-21 HISTORY — DX: Urinary tract infection, site not specified: N39.0

## 2016-09-21 LAB — CBC
HCT: 41.3 % (ref 36.0–46.0)
Hemoglobin: 13.3 g/dL (ref 12.0–15.0)
MCH: 30.9 pg (ref 26.0–34.0)
MCHC: 32.2 g/dL (ref 30.0–36.0)
MCV: 96 fL (ref 78.0–100.0)
Platelets: 334 10*3/uL (ref 150–400)
RBC: 4.3 MIL/uL (ref 3.87–5.11)
RDW: 13.4 % (ref 11.5–15.5)
WBC: 7.8 10*3/uL (ref 4.0–10.5)

## 2016-09-21 LAB — COMPREHENSIVE METABOLIC PANEL
ALK PHOS: 90 U/L (ref 38–126)
ALT: 21 U/L (ref 14–54)
AST: 20 U/L (ref 15–41)
Albumin: 3.8 g/dL (ref 3.5–5.0)
Anion gap: 9 (ref 5–15)
BILIRUBIN TOTAL: 0.7 mg/dL (ref 0.3–1.2)
BUN: 14 mg/dL (ref 6–20)
CALCIUM: 9.1 mg/dL (ref 8.9–10.3)
CO2: 27 mmol/L (ref 22–32)
Chloride: 102 mmol/L (ref 101–111)
Creatinine, Ser: 1.04 mg/dL — ABNORMAL HIGH (ref 0.44–1.00)
GFR calc Af Amer: >60 mL/min (ref >60)
GFR, EST NON AFRICAN AMERICAN: 55 mL/min — AB (ref >60)
Glucose, Bld: 145 mg/dL — ABNORMAL HIGH (ref 65–99)
POTASSIUM: 3.8 mmol/L (ref 3.5–5.1)
Sodium: 138 mmol/L (ref 135–145)
Total Protein: 7.9 g/dL (ref 6.5–8.1)

## 2016-09-21 LAB — TYPE AND SCREEN
ABO/RH(D): O POS
Antibody Screen: NEGATIVE

## 2016-09-25 ENCOUNTER — Encounter (HOSPITAL_COMMUNITY): Payer: Self-pay | Admitting: *Deleted

## 2016-09-26 ENCOUNTER — Encounter (HOSPITAL_COMMUNITY): Payer: Self-pay | Admitting: Emergency Medicine

## 2016-09-26 ENCOUNTER — Ambulatory Visit (HOSPITAL_COMMUNITY)
Admission: RE | Admit: 2016-09-26 | Discharge: 2016-09-27 | Disposition: A | Discharge status: Home / Self Care | Payer: Medicare Other | Source: Ambulatory Visit | Attending: Family Medicine | Admitting: Family Medicine

## 2016-09-26 ENCOUNTER — Ambulatory Visit (HOSPITAL_COMMUNITY): Payer: Medicare Other | Admitting: Anesthesiology

## 2016-09-26 ENCOUNTER — Encounter (HOSPITAL_COMMUNITY)
Admission: RE | Disposition: A | Discharge status: Home / Self Care | Payer: Self-pay | Source: Ambulatory Visit | Attending: Family Medicine

## 2016-09-26 DIAGNOSIS — Z791 Long term (current) use of non-steroidal anti-inflammatories (NSAID): Secondary | ICD-10-CM | POA: Insufficient documentation

## 2016-09-26 DIAGNOSIS — E669 Obesity, unspecified: Secondary | ICD-10-CM | POA: Insufficient documentation

## 2016-09-26 DIAGNOSIS — D259 Leiomyoma of uterus, unspecified: Secondary | ICD-10-CM | POA: Diagnosis not present

## 2016-09-26 DIAGNOSIS — Z23 Encounter for immunization: Secondary | ICD-10-CM | POA: Insufficient documentation

## 2016-09-26 DIAGNOSIS — Z7982 Long term (current) use of aspirin: Secondary | ICD-10-CM | POA: Insufficient documentation

## 2016-09-26 DIAGNOSIS — R9389 Abnormal findings on diagnostic imaging of other specified body structures: Secondary | ICD-10-CM | POA: Diagnosis present

## 2016-09-26 DIAGNOSIS — N888 Other specified noninflammatory disorders of cervix uteri: Secondary | ICD-10-CM | POA: Insufficient documentation

## 2016-09-26 DIAGNOSIS — Z79899 Other long term (current) drug therapy: Secondary | ICD-10-CM | POA: Diagnosis not present

## 2016-09-26 DIAGNOSIS — Z6834 Body mass index (BMI) 34.0-34.9, adult: Secondary | ICD-10-CM | POA: Insufficient documentation

## 2016-09-26 DIAGNOSIS — N950 Postmenopausal bleeding: Principal | ICD-10-CM | POA: Diagnosis present

## 2016-09-26 DIAGNOSIS — N852 Hypertrophy of uterus: Secondary | ICD-10-CM | POA: Insufficient documentation

## 2016-09-26 DIAGNOSIS — Z90710 Acquired absence of both cervix and uterus: Secondary | ICD-10-CM | POA: Diagnosis present

## 2016-09-26 DIAGNOSIS — R938 Abnormal findings on diagnostic imaging of other specified body structures: Secondary | ICD-10-CM | POA: Diagnosis not present

## 2016-09-26 DIAGNOSIS — I10 Essential (primary) hypertension: Secondary | ICD-10-CM | POA: Diagnosis not present

## 2016-09-26 HISTORY — PX: VAGINAL HYSTERECTOMY: SHX2639

## 2016-09-26 SURGERY — HYSTERECTOMY, VAGINAL
Anesthesia: General | Site: Vagina | Laterality: Bilateral

## 2016-09-26 MED ORDER — FENTANYL CITRATE (PF) 100 MCG/2ML IJ SOLN: 25.0000 ug | INTRAMUSCULAR | Status: DC | PRN

## 2016-09-26 MED ORDER — ONDANSETRON HCL 4 MG/2ML IJ SOLN: 4.0000 mg | Freq: Four times a day (QID) | INTRAMUSCULAR | Status: DC | PRN

## 2016-09-26 MED ORDER — LACTATED RINGERS IV SOLN: INTRAVENOUS | Status: DC

## 2016-09-26 MED ORDER — GLYCOPYRROLATE 0.2 MG/ML IJ SOLN
INTRAMUSCULAR | Status: DC | PRN
  Administered 2016-09-26: 0.1 mg via INTRAVENOUS

## 2016-09-26 MED ORDER — ENOXAPARIN SODIUM 40 MG/0.4ML ~~LOC~~ SOLN
40.0000 mg | SUBCUTANEOUS | Status: DC
  Administered 2016-09-27: 40 mg via SUBCUTANEOUS
  Filled 2016-09-26 (×2): qty 0.4

## 2016-09-26 MED ORDER — ONDANSETRON HCL 4 MG/2ML IJ SOLN
INTRAMUSCULAR | Status: DC | PRN
  Administered 2016-09-26: 4 mg via INTRAVENOUS

## 2016-09-26 MED ORDER — PROMETHAZINE HCL 25 MG/ML IJ SOLN: 6.2500 mg | INTRAMUSCULAR | Status: DC | PRN

## 2016-09-26 MED ORDER — LIDOCAINE-EPINEPHRINE 1 %-1:100000 IJ SOLN
INTRAMUSCULAR | Status: DC | PRN
  Administered 2016-09-26: 20 mL

## 2016-09-26 MED ORDER — SUGAMMADEX SODIUM 200 MG/2ML IV SOLN
INTRAVENOUS | Status: AC
  Filled 2016-09-26: qty 2

## 2016-09-26 MED ORDER — METOCLOPRAMIDE HCL 5 MG/ML IJ SOLN
INTRAMUSCULAR | Status: AC
  Filled 2016-09-26: qty 2

## 2016-09-26 MED ORDER — SUGAMMADEX SODIUM 200 MG/2ML IV SOLN
INTRAVENOUS | Status: DC | PRN
  Administered 2016-09-26: 182.6 mg via INTRAVENOUS

## 2016-09-26 MED ORDER — FENTANYL CITRATE (PF) 100 MCG/2ML IJ SOLN
INTRAMUSCULAR | Status: DC | PRN
  Administered 2016-09-26 (×4): 50 ug via INTRAVENOUS

## 2016-09-26 MED ORDER — FENTANYL CITRATE (PF) 250 MCG/5ML IJ SOLN
INTRAMUSCULAR | Status: AC
  Filled 2016-09-26: qty 5

## 2016-09-26 MED ORDER — LIDOCAINE HCL (CARDIAC) 20 MG/ML IV SOLN
INTRAVENOUS | Status: DC | PRN
  Administered 2016-09-26: 65 mg via INTRAVENOUS

## 2016-09-26 MED ORDER — SODIUM CHLORIDE 0.9% FLUSH: 9.0000 mL | INTRAVENOUS | Status: DC | PRN

## 2016-09-26 MED ORDER — PROPOFOL 10 MG/ML IV BOLUS
INTRAVENOUS | Status: DC | PRN
  Administered 2016-09-26: 30 mg via INTRAVENOUS
  Administered 2016-09-26: 120 mg via INTRAVENOUS

## 2016-09-26 MED ORDER — LACTATED RINGERS IV SOLN
INTRAVENOUS | Status: DC
  Administered 2016-09-26 (×2): via INTRAVENOUS

## 2016-09-26 MED ORDER — DIPHENHYDRAMINE HCL 50 MG/ML IJ SOLN
INTRAMUSCULAR | Status: AC
  Filled 2016-09-26: qty 1

## 2016-09-26 MED ORDER — PNEUMOCOCCAL VAC POLYVALENT 25 MCG/0.5ML IJ INJ
0.5000 mL | INJECTION | INTRAMUSCULAR | Status: AC
  Administered 2016-09-27: 0.5 mL via INTRAMUSCULAR
  Filled 2016-09-26 (×2): qty 0.5

## 2016-09-26 MED ORDER — HYDROMORPHONE 1 MG/ML IV SOLN
INTRAVENOUS | Status: DC
  Administered 2016-09-26: 18:00:00 via INTRAVENOUS
  Filled 2016-09-26: qty 25

## 2016-09-26 MED ORDER — DEXAMETHASONE SODIUM PHOSPHATE 10 MG/ML IJ SOLN
INTRAMUSCULAR | Status: DC | PRN
  Administered 2016-09-26: 4 mg via INTRAVENOUS

## 2016-09-26 MED ORDER — LIDOCAINE HCL (CARDIAC) 20 MG/ML IV SOLN
INTRAVENOUS | Status: AC
  Filled 2016-09-26: qty 5

## 2016-09-26 MED ORDER — ESTRADIOL 0.1 MG/GM VA CREA
TOPICAL_CREAM | VAGINAL | Status: AC
  Filled 2016-09-26: qty 42.5

## 2016-09-26 MED ORDER — DIPHENHYDRAMINE HCL 50 MG/ML IJ SOLN: 12.5000 mg | Freq: Four times a day (QID) | INTRAMUSCULAR | Status: DC | PRN

## 2016-09-26 MED ORDER — ROCURONIUM BROMIDE 100 MG/10ML IV SOLN
INTRAVENOUS | Status: AC
  Filled 2016-09-26: qty 1

## 2016-09-26 MED ORDER — MIDAZOLAM HCL 2 MG/2ML IJ SOLN
INTRAMUSCULAR | Status: AC
  Filled 2016-09-26: qty 2

## 2016-09-26 MED ORDER — ESTRADIOL 0.1 MG/GM VA CREA
TOPICAL_CREAM | VAGINAL | Status: DC | PRN
  Administered 2016-09-26: 1 via VAGINAL

## 2016-09-26 MED ORDER — CEFAZOLIN SODIUM-DEXTROSE 2-4 GM/100ML-% IV SOLN
2.0000 g | INTRAVENOUS | Status: AC
  Administered 2016-09-26: 2 g via INTRAVENOUS

## 2016-09-26 MED ORDER — PROPOFOL 10 MG/ML IV BOLUS
INTRAVENOUS | Status: AC
  Filled 2016-09-26: qty 20

## 2016-09-26 MED ORDER — MENTHOL 3 MG MT LOZG: 1.0000 | LOZENGE | OROMUCOSAL | Status: DC | PRN

## 2016-09-26 MED ORDER — LIDOCAINE-EPINEPHRINE 1 %-1:100000 IJ SOLN
INTRAMUSCULAR | Status: AC
  Filled 2016-09-26: qty 1

## 2016-09-26 MED ORDER — OXYCODONE-ACETAMINOPHEN 5-325 MG PO TABS
1.0000 | ORAL_TABLET | ORAL | Status: DC | PRN
  Administered 2016-09-27 (×2): 2 via ORAL
  Filled 2016-09-26 (×2): qty 2

## 2016-09-26 MED ORDER — DIPHENHYDRAMINE HCL 12.5 MG/5ML PO ELIX: 12.5000 mg | ORAL_SOLUTION | Freq: Four times a day (QID) | ORAL | Status: DC | PRN

## 2016-09-26 MED ORDER — KETOROLAC TROMETHAMINE 30 MG/ML IJ SOLN
INTRAMUSCULAR | Status: AC
  Filled 2016-09-26: qty 1

## 2016-09-26 MED ORDER — ROCURONIUM BROMIDE 100 MG/10ML IV SOLN
INTRAVENOUS | Status: DC | PRN
  Administered 2016-09-26: 40 mg via INTRAVENOUS
  Administered 2016-09-26: 10 mg via INTRAVENOUS

## 2016-09-26 MED ORDER — LACTATED RINGERS IV SOLN
INTRAVENOUS | Status: DC
  Administered 2016-09-26 – 2016-09-27 (×2): via INTRAVENOUS

## 2016-09-26 MED ORDER — MIDAZOLAM HCL 2 MG/2ML IJ SOLN
INTRAMUSCULAR | Status: DC | PRN
  Administered 2016-09-26: 1 mg via INTRAVENOUS

## 2016-09-26 MED ORDER — KETOROLAC TROMETHAMINE 30 MG/ML IJ SOLN
INTRAMUSCULAR | Status: DC | PRN
  Administered 2016-09-26: 30 mg via INTRAVENOUS

## 2016-09-26 MED ORDER — NALOXONE HCL 0.4 MG/ML IJ SOLN: 0.4000 mg | INTRAMUSCULAR | Status: DC | PRN

## 2016-09-26 SURGICAL SUPPLY — 26 items
CANISTER SUCT 3000ML PPV (MISCELLANEOUS) ×3 IMPLANT
CLOTH BEACON ORANGE TIMEOUT ST (SAFETY) ×3 IMPLANT
CONT PATH 16OZ SNAP LID 3702 (MISCELLANEOUS) ×3 IMPLANT
DECANTER SPIKE VIAL GLASS SM (MISCELLANEOUS) IMPLANT
GAUZE PACKING 2X5 YD STRL (GAUZE/BANDAGES/DRESSINGS) ×3 IMPLANT
GLOVE BIOGEL PI IND STRL 6.5 (GLOVE) ×2 IMPLANT
GLOVE BIOGEL PI IND STRL 7.0 (GLOVE) ×2 IMPLANT
GLOVE BIOGEL PI INDICATOR 6.5 (GLOVE) ×4
GLOVE BIOGEL PI INDICATOR 7.0 (GLOVE) ×4
GLOVE ECLIPSE 7.0 STRL STRAW (GLOVE) ×6 IMPLANT
GOWN STRL REUS W/TWL LRG LVL3 (GOWN DISPOSABLE) ×15 IMPLANT
NEEDLE HYPO 22GX1.5 SAFETY (NEEDLE) IMPLANT
NEEDLE SPNL 18GX3.5 QUINCKE PK (NEEDLE) ×3 IMPLANT
NS IRRIG 1000ML POUR BTL (IV SOLUTION) ×3 IMPLANT
PACK TRENDGUARD 600 HYBRD PROC (MISCELLANEOUS) IMPLANT
PACK VAGINAL WOMENS (CUSTOM PROCEDURE TRAY) ×3 IMPLANT
PAD OB MATERNITY 4.3X12.25 (PERSONAL CARE ITEMS) ×3 IMPLANT
SUT VIC AB 0 CT1 18XCR BRD8 (SUTURE) ×2 IMPLANT
SUT VIC AB 0 CT1 27 (SUTURE) ×4
SUT VIC AB 0 CT1 27XBRD ANBCTR (SUTURE) ×2 IMPLANT
SUT VIC AB 0 CT1 8-18 (SUTURE) ×4
SUT VICRYL 0 TIES 12 18 (SUTURE) ×3 IMPLANT
SYR 20CC LL (SYRINGE) ×3 IMPLANT
TOWEL OR 17X24 6PK STRL BLUE (TOWEL DISPOSABLE) ×6 IMPLANT
TRAY FOLEY CATH SILVER 14FR (SET/KITS/TRAYS/PACK) ×3 IMPLANT
TRENDGUARD 600 HYBRID PROC PK (MISCELLANEOUS)

## 2016-09-26 NOTE — Anesthesia Procedure Notes (Addendum)
Procedure Name: Intubation Date/Time: 09/26/2016 12:34 PM Performed by: Flossie Dibble Pre-anesthesia Checklist: Patient identified, Patient being monitored, Timeout performed, Emergency Drugs available and Suction available Patient Re-evaluated:Patient Re-evaluated prior to induction Oxygen Delivery Method: Circle System Utilized Preoxygenation: Pre-oxygenation with 100% oxygen Induction Type: IV induction Ventilation: Mask ventilation without difficulty and Oral airway inserted - appropriate to patient size Laryngoscope Size: Mac and 3 Grade View: Grade II Tube type: Oral Tube size: 7.0 mm Number of attempts: 1 Airway Equipment and Method: stylet and Stylet Placement Confirmation: ETT inserted through vocal cords under direct vision,  positive ETCO2 and breath sounds checked- equal and bilateral Secured at: 21 cm Tube secured with: Tape Dental Injury: Teeth and Oropharynx as per pre-operative assessment

## 2016-09-26 NOTE — Anesthesia Postprocedure Evaluation (Signed)
Anesthesia Post Note  Patient: Tanya Nunez  Procedure(s) Performed: Procedure(s) (LRB): HYSTERECTOMY VAGINAL W/ BILATERAL SALPINGECTOMY (Bilateral)     Patient location during evaluation: PACU Anesthesia Type: General Level of consciousness: awake and alert Pain management: pain level controlled Vital Signs Assessment: post-procedure vital signs reviewed and stable Respiratory status: spontaneous breathing, nonlabored ventilation and respiratory function stable Cardiovascular status: blood pressure returned to baseline and stable Postop Assessment: no signs of nausea or vomiting Anesthetic complications: no    Last Vitals:  Vitals:   09/26/16 1606 09/26/16 1622  BP:  (!) 146/86  Pulse:  78  Resp:  16  Temp:  36.6 C  SpO2: 98% 100%    Last Pain:  Vitals:   09/26/16 1622  TempSrc: Oral  PainSc: 4    Pain Goal: Patients Stated Pain Goal: 4 (09/26/16 1004)               Audry Pili

## 2016-09-26 NOTE — Interval H&P Note (Signed)
History and Physical Interval Note:  09/26/2016 12:11 PM  Tanya Nunez  has presented today for surgery, with the diagnosis of PMB  The various methods of treatment have been discussed with the patient and family. After consideration of risks, benefits and other options for treatment, the patient has consented to  Procedure(s): HYSTERECTOMY VAGINAL W/ BILATERAL SALPINGO/OOPHORECTOMY (Bilateral) as a surgical intervention .  The patient's history has been reviewed, patient examined, no change in status, stable for surgery.  I have reviewed the patient's chart and labs.  Questions were answered to the patient's satisfaction.     [name]

## 2016-09-26 NOTE — Transfer of Care (Signed)
Immediate Anesthesia Transfer of Care Note  Patient: Tanya Nunez  Procedure(s) Performed: Procedure(s): HYSTERECTOMY VAGINAL W/ BILATERAL SALPINGECTOMY (Bilateral)  Patient Location: PACU  Anesthesia Type:General  Level of Consciousness: awake, drowsy and patient cooperative  Airway & Oxygen Therapy: Patient Spontanous Breathing and Patient connected to nasal cannula oxygen  Post-op Assessment: Report given to RN and Post -op Vital signs reviewed and stable  Post vital signs: Reviewed and stable  Last Vitals:  Vitals:   09/26/16 1004  BP: (!) 141/86  Pulse: 90  Resp: 16  Temp: 37 C    Last Pain:  Vitals:   09/26/16 1004  TempSrc: Oral  PainSc: 2       Patients Stated Pain Goal: 4 (19/16/60 6004)  Complications: No apparent anesthesia complications

## 2016-09-26 NOTE — Anesthesia Postprocedure Evaluation (Signed)
Anesthesia Post Note  Patient: Tanya Nunez  Procedure(s) Performed: Procedure(s) (LRB): HYSTERECTOMY VAGINAL W/ BILATERAL SALPINGECTOMY (Bilateral)     Patient location during evaluation: [facility] Anesthesia Type: General Level of consciousness: awake and alert and oriented Pain management: pain level controlled Vital Signs Assessment: post-procedure vital signs reviewed and stable Respiratory status: spontaneous breathing, nonlabored ventilation and respiratory function stable Cardiovascular status: stable Postop Assessment: no signs of nausea or vomiting and adequate PO intake Anesthetic complications: no    Last Vitals:  Vitals:   09/26/16 1758 09/26/16 1830  BP:    Pulse: 84 89  Resp: 20 (!) 22  Temp:    SpO2: 94% 95%    Last Pain:  Vitals:   09/26/16 1930  TempSrc:   PainSc: 4    Pain Goal: Patients Stated Pain Goal: 4 (09/26/16 1725)               Willa Rough

## 2016-09-26 NOTE — Addendum Note (Signed)
Addendum  created 09/26/16 1937 by Jonna Munro, CRNA   Sign clinical note

## 2016-09-26 NOTE — Op Note (Addendum)
Preoperative diagnosis: postmenpausal bleeding  Postoperative diagnosis: Same  Procedure: Transvaginal hysterectomy and bilateral salpingectomy   Surgeon: Standley Dakins. Kennon Rounds, M.D.  Assistant: Lavonia Drafts, MD  Anesthesia: General ETT  Findings: 397 gm uterus.  Estimated blood loss: 300 cc  Specimen: Uterus and tubes to pathology  Reason for procedure: Patient had long h/o bleeding even on Megace.  Had 2 previous D and C and office EMB all of which were benign. Failed Megace and  desired definitive treament. The patient desired definitive treatment.  Risks of  hysterectomy reviewed.  Risks include but are not limited to bleeding, infection, injury to surrounding structures, including bowel, bladder and ureters, blood clots, and death.  Likelihood of success of surgery is high.   Procedure: Patient was taken to the OR where she was placed in dorsal lithotomy in [place]. She was prepped and draped in the usual sterile fashion. A timeout was performed. The patient received 1 g of Ancef prior to procedure. The patient had SCDs in place.  A speculum was placed inside the vagina. The cervix was visualized and grasped with 2 doublle-tooth tenacula. 20 cc of 1% lidocaine with epinephrine were injected paracervically. A knife was used to make a circumferential incision around the vagina. An opened sponge was used to dissect the vagina off the cervix. The posterior peritoneum was entered sharply with Mayo scissors. The posterior peritoneum was tagged to the vaginal cuff with a single stitch. The anterior peritoneal cavity was entered sharply with careful dissection of the bladder off the underlying cervix. A Heaney clamp was used to clamp first the left uterosacral ligament and cardinal which was then cut and Haney suture ligated with 0 Vicryl stitch, the stitch was held. Similarly the right uterosacral ligament was clamped cut and suture ligated. Sequential bites up the broad to the uterine  arteries were taken. The posterior uterus was morcellated until the uterus was delivered from the vagina. The tubo-ovarian pedicles were encountered grasped with a Heaney clamp and suture ligated.The left  tube was grasped with Babcock clamp and a Heaney clamp placed behind this. Tube removed and ligated with a free tie. The right tube grasped with a Babcock clamp and a Kelly clamp used to clamp behind this. The tube removed on the side and ligated with a free tie. Inspection of all pedicles revealed adequate hemostasis. There was some bleeding noted at the vaginal cuff on the right and left and 2 figure of eights used to achieve hemostasis. The vagina was closed with 0 Vicryl suture in a locked running fashion with care taken to incorporate the uterosacral pedicles. Excellent hemostasis was noted at the end of the case. The vaginal cuff was inspected there was minimal bleeding noted.  A Foley catheter is placed inside her bladder. Clear, yellow urine was noted. Vaginal packing with estrace cream placed. All instrument needle and lap counts were correct x 2. Patient was awakened taken to recovery room in stable condition.  [name], MD 09/26/2016, 2:12 PM

## 2016-09-26 NOTE — Anesthesia Preprocedure Evaluation (Addendum)
Anesthesia Evaluation  Patient identified by MRN, date of birth, ID band Patient awake    Reviewed: Allergy & Precautions, NPO status , Patient's Chart, lab work & pertinent test results  Airway Mallampati: II  TM Distance: >3 FB Neck ROM: Full    Dental  (+) Dental Advisory Given, Edentulous Upper   Pulmonary neg pulmonary ROS,    Pulmonary exam normal breath sounds clear to auscultation       Cardiovascular Exercise Tolerance: Good hypertension, Pt. on medications Normal cardiovascular exam Rhythm:Regular Rate:Normal + Systolic murmurs    Neuro/Psych  Headaches, negative psych ROS   GI/Hepatic negative GI ROS, Neg liver ROS,   Endo/Other  Obesity  Renal/GU negative Renal ROS  negative genitourinary   Musculoskeletal  (+) Arthritis ,   Abdominal (+) + obese,   Peds  Hematology  (+) anemia ,   Anesthesia Other Findings   Reproductive/Obstetrics Post Menopausal bleeding                           Lab Results  Component Value Date   WBC 7.8 09/21/2016   HGB 13.3 09/21/2016   HCT 41.3 09/21/2016   MCV 96.0 09/21/2016   PLT 334 09/21/2016     Chemistry      Component Value Date/Time   NA 138 09/21/2016 0940   K 3.8 09/21/2016 0940   CL 102 09/21/2016 0940   CO2 27 09/21/2016 0940   BUN 14 09/21/2016 0940   CREATININE 1.04 (H) 09/21/2016 0940      Component Value Date/Time   CALCIUM 9.1 09/21/2016 0940   ALKPHOS 90 09/21/2016 0940   AST 20 09/21/2016 0940   ALT 21 09/21/2016 0940   BILITOT 0.7 09/21/2016 0940      Anesthesia Physical  Anesthesia Plan  ASA: II  Anesthesia Plan: General   Post-op Pain Management:    Induction: Intravenous  PONV Risk Score and Plan: 4 or greater and Ondansetron, Dexamethasone, Midazolam, Scopolamine patch - Pre-op, Treatment may vary due to age or medical condition and Propofol infusion  Airway Management Planned: Oral  ETT  Additional Equipment: None  Intra-op Plan:   Post-operative Plan: Extubation in OR  Informed Consent: I have reviewed the patients History and Physical, chart, labs and discussed the procedure including the risks, benefits and alternatives for the proposed anesthesia with the patient or authorized representative who has indicated his/her understanding and acceptance.   Dental advisory given  Plan Discussed with: CRNA  Anesthesia Plan Comments:        Anesthesia Quick Evaluation

## 2016-09-27 ENCOUNTER — Encounter (HOSPITAL_COMMUNITY): Payer: Self-pay | Admitting: Family Medicine

## 2016-09-27 DIAGNOSIS — D259 Leiomyoma of uterus, unspecified: Secondary | ICD-10-CM | POA: Diagnosis not present

## 2016-09-27 DIAGNOSIS — I10 Essential (primary) hypertension: Secondary | ICD-10-CM | POA: Diagnosis not present

## 2016-09-27 DIAGNOSIS — Z23 Encounter for immunization: Secondary | ICD-10-CM | POA: Diagnosis not present

## 2016-09-27 DIAGNOSIS — N888 Other specified noninflammatory disorders of cervix uteri: Secondary | ICD-10-CM | POA: Diagnosis not present

## 2016-09-27 DIAGNOSIS — N950 Postmenopausal bleeding: Secondary | ICD-10-CM | POA: Diagnosis not present

## 2016-09-27 DIAGNOSIS — N852 Hypertrophy of uterus: Secondary | ICD-10-CM | POA: Diagnosis not present

## 2016-09-27 LAB — CBC
HCT: 34 % — ABNORMAL LOW (ref 36.0–46.0)
Hemoglobin: 11.1 g/dL — ABNORMAL LOW (ref 12.0–15.0)
MCH: 31.1 pg (ref 26.0–34.0)
MCHC: 32.6 g/dL (ref 30.0–36.0)
MCV: 95.2 fL (ref 78.0–100.0)
PLATELETS: 285 10*3/uL (ref 150–400)
RBC: 3.57 MIL/uL — AB (ref 3.87–5.11)
RDW: 13.4 % (ref 11.5–15.5)
WBC: 13.5 10*3/uL — ABNORMAL HIGH (ref 4.0–10.5)

## 2016-09-27 LAB — COMPREHENSIVE METABOLIC PANEL
ALT: 14 U/L (ref 14–54)
ANION GAP: 7 (ref 5–15)
AST: 18 U/L (ref 15–41)
Albumin: 3 g/dL — ABNORMAL LOW (ref 3.5–5.0)
Alkaline Phosphatase: 65 U/L (ref 38–126)
BUN: 12 mg/dL (ref 6–20)
CHLORIDE: 103 mmol/L (ref 101–111)
CO2: 26 mmol/L (ref 22–32)
Calcium: 8.4 mg/dL — ABNORMAL LOW (ref 8.9–10.3)
Creatinine, Ser: 0.98 mg/dL (ref 0.44–1.00)
GFR calc Af Amer: >60 mL/min (ref >60)
GFR, EST NON AFRICAN AMERICAN: 59 mL/min — AB (ref >60)
Glucose, Bld: 171 mg/dL — ABNORMAL HIGH (ref 65–99)
POTASSIUM: 3.6 mmol/L (ref 3.5–5.1)
SODIUM: 136 mmol/L (ref 135–145)
Total Bilirubin: 0.7 mg/dL (ref 0.3–1.2)
Total Protein: 6.2 g/dL — ABNORMAL LOW (ref 6.5–8.1)

## 2016-09-27 MED ORDER — OXYCODONE-ACETAMINOPHEN 5-325 MG PO TABS: 1.0000 | ORAL_TABLET | ORAL | 0 refills | Status: AC | PRN

## 2016-09-27 NOTE — Progress Notes (Signed)
Discharged home ambulatory in stable condition.

## 2016-09-27 NOTE — Discharge Summary (Signed)
Physician Discharge Summary  Patient ID: Tanya Nunez MRN: [id number] DOB/AGE: 08-12-1950 66 y.o.  Admit date: 09/26/2016 Discharge date:   Admission Diagnoses:  Principal Problem:   PMB (postmenopausal bleeding) Active Problems:   Thickened endometrium   S/P vaginal hysterectomy   Discharge Diagnoses:  Same  Past Medical History:  Diagnosis Date  . Arthritis    hands, knees - no meds  . Headache    history of Migraines  . Heart murmur   . Hypertension   . Irregular heart beats   . Shoulder pain, right   . SVD (spontaneous vaginal delivery)    x 4  . UTI (urinary tract infection)     Surgeries: Procedure(s): HYSTERECTOMY VAGINAL W/ BILATERAL SALPINGECTOMY on 09/26/2016   Consultants: None  Discharged Condition: Improved  Hospital Course: Tanya Nunez is an 66 y.o. female [phone] who was admitted 09/26/2016 with a chief complaint of No chief complaint on file. , and found to have a diagnosis of PMB (postmenopausal bleeding).  They were brought to the operating room on 09/26/2016 and underwent the above named procedures.    They were given perioperative antibiotics:  Anti-infectives    Start     Dose/Rate Route Frequency Ordered Stop   09/26/16 1008  ceFAZolin (ANCEF) IVPB 2g/100 mL premix     2 g 200 mL/hr over 30 Minutes Intravenous On call to O.R. 09/26/16 1008 09/26/16 1246    .  They were given sequential compression devices, early ambulation, and chemoprophylaxis for DVT prophylaxis. She was voiding ambulating and tolerating po.  They benefited maximally from their hospital stay and there were no complications.    Recent vital signs:  Vitals:   09/27/16 0400 09/27/16 0602  BP: 127/65   Pulse: 87   Resp: 18 18  Temp: 98.6 F (37 C)   SpO2: 97% 94%   Physical Examination: General appearance - alert, well appearing, and in no distress Chest - normal effort Abdomen - soft, appropriately tender Extremities - peripheral pulses normal, no pedal edema,  no clubbing or cyanosis, Homan's sign negative bilaterally  Recent laboratory studies:  Results for orders placed or performed during the hospital encounter of 09/26/16  CBC  Result Value Ref Range   WBC 13.5 (H) 4.0 - 10.5 K/uL   RBC 3.57 (L) 3.87 - 5.11 MIL/uL   Hemoglobin 11.1 (L) 12.0 - 15.0 g/dL   HCT 34.0 (L) 36.0 - 46.0 %   MCV 95.2 78.0 - 100.0 fL   MCH 31.1 26.0 - 34.0 pg   MCHC 32.6 30.0 - 36.0 g/dL   RDW 13.4 11.5 - 15.5 %   Platelets 285 150 - 400 K/uL  Comprehensive metabolic panel  Result Value Ref Range   Sodium 136 135 - 145 mmol/L   Potassium 3.6 3.5 - 5.1 mmol/L   Chloride 103 101 - 111 mmol/L   CO2 26 22 - 32 mmol/L   Glucose, Bld 171 (H) 65 - 99 mg/dL   BUN 12 6 - 20 mg/dL   Creatinine, Ser 0.98 0.44 - 1.00 mg/dL   Calcium 8.4 (L) 8.9 - 10.3 mg/dL   Total Protein 6.2 (L) 6.5 - 8.1 g/dL   Albumin 3.0 (L) 3.5 - 5.0 g/dL   AST 18 15 - 41 U/L   ALT 14 14 - 54 U/L   Alkaline Phosphatase 65 38 - 126 U/L   Total Bilirubin 0.7 0.3 - 1.2 mg/dL   GFR calc non Af Amer 59 (L) >60 mL/min   GFR  calc Af Amer >60 >60 mL/min   Anion gap 7 5 - 15    Discharge Medications:   Allergies as of 09/27/2016   No Known Allergies     Medication List    TAKE these medications   amLODipine 5 MG tablet Commonly known as:  NORVASC Take 5 mg by mouth daily.   aspirin EC 81 MG tablet Take 81 mg by mouth daily.   cholecalciferol 1000 units tablet Commonly known as:  VITAMIN D Take 1,000 Units by mouth daily.   cyclobenzaprine 10 MG tablet Commonly known as:  FLEXERIL Take 1 tablet (10 mg total) by mouth every 8 (eight) hours as needed for muscle spasms.   ibuprofen 800 MG tablet Commonly known as:  ADVIL,MOTRIN Take 800 mg by mouth every 6 (six) hours as needed (pain).   loratadine 10 MG tablet Commonly known as:  CLARITIN Take 10 mg by mouth daily as needed for allergies.   losartan-hydrochlorothiazide 50-12.5 MG tablet Commonly known as:  HYZAAR Take 1 tablet  by mouth daily.   meloxicam 15 MG tablet Commonly known as:  MOBIC Take 15 mg by mouth daily as needed for pain.   multivitamin with minerals Tabs tablet Take 1 tablet by mouth every Monday, Wednesday, and Friday.   oxyCODONE-acetaminophen 5-325 MG tablet Commonly known as:  PERCOCET/ROXICET Take 1-2 tablets by mouth every 4 (four) hours as needed for severe pain (moderate to severe pain (when tolerating fluids)).            Discharge Care Instructions        Start     Ordered   09/27/16 0000  oxyCODONE-acetaminophen (PERCOCET/ROXICET) 5-325 MG tablet  Every 4 hours PRN     09/27/16 0813   09/27/16 0000  Diet - low sodium heart healthy     09/27/16 0813   09/27/16 0000  Increase activity slowly     09/27/16 0813   09/27/16 0000  Call MD for:  temperature >100.4     09/27/16 0813   09/27/16 0000  Call MD for:  persistant nausea and vomiting     09/27/16 0813   09/27/16 0000  Call MD for:  severe uncontrolled pain     09/27/16 0813   09/27/16 0000  Call MD for:  difficulty breathing, headache or visual disturbances     09/27/16 0813   09/27/16 0000  Driving Restrictions    Comments:  None while taking pain meds   09/27/16 0813   09/27/16 0000  Lifting restrictions    Comments:  Nothing > 20 lbs x 4-6 wks   09/27/16 0813   09/27/16 0000  Sexual Activity Restrictions    Comments:  None x 6 wks   09/27/16 0813   09/27/16 0000  No wound care     09/27/16 0813      Diagnostic Studies: No results found.  Disposition: 01-Home or Self Care  Discharge Instructions    Call MD for:  difficulty breathing, headache or visual disturbances    Complete by:  As directed    Call MD for:  persistant nausea and vomiting    Complete by:  As directed    Call MD for:  severe uncontrolled pain    Complete by:  As directed    Call MD for:  temperature >100.4    Complete by:  As directed    Diet - low sodium heart healthy    Complete by:  As directed    Driving Restrictions  Complete by:  As directed    None while taking pain meds   Increase activity slowly    Complete by:  As directed    Lifting restrictions    Complete by:  As directed    Nothing > 20 lbs x 4-6 wks   No wound care    Complete by:  As directed    Sexual Activity Restrictions    Complete by:  As directed    None x 6 wks      Follow-up [facility] up in 2 week(s).   Specialty:  Obstetrics and Gynecology Why:  they will call you with an appointment Contact information: [place] [phone]           Signed: [name] 09/27/2016, 8:15 AM

## 2016-09-27 NOTE — Discharge Instructions (Signed)
Vaginal Hysterectomy, Care After °This sheet gives you information about how to care for yourself after your procedure. Your doctor may also give you more specific instructions. If you have problems or questions, contact your doctor. °Follow these instructions at home: °Bathing °· Do not take baths, swim, or use a hot tub until your doctor says it is okay. Ask your doctor if you can take showers. You may only be allowed to take sponge baths for bathing. °· Keep the bandage (dressing) dry until your doctor says it can be taken off. °Surgical cut ( °incision) care °· Follow instructions from your doctor about how to take care of your cut from surgery. Make sure you: °? Wash your hands with soap and water before you change your bandage (dressing). If you cannot use soap and water, use hand sanitizer. °? Change your bandage as told by your doctor. °? Leave stitches (sutures), skin glue, or skin tape (adhesive) strips in place. They may need to stay in place for 2 weeks or longer. If tape strips get loose and curl up, you may trim the loose edges. Do not remove tape strips completely unless your doctor says it is okay. °· Check your surgical cut area every day for signs of infection. Check for: °? Redness, swelling, or pain. °? Fluid or blood. °? Warmth. °? Pus or a bad smell. °Activity °· Do gentle, daily exercise as told by your doctor. You may be told to take short walks every day and go farther each time. °· Do not lift anything that is heavier than 20 lb (4.5 kg), or the limit that your doctor tells you, until he or she says that it is safe. °· Do not drive or use heavy machinery while taking prescription pain medicine. °· Do not drive for 24 hours if you were given a medicine to help you relax (sedative). °· Follow your doctor's advice about exercise, driving, and general activities. Ask your doctor what activities are safe for you. °Lifestyle °· Do not douche, use tampons, or have sex for at least 6 weeks or as  told by your doctor. °· Do not drink alcohol until your doctor says it is okay. °· Drink enough fluid to keep your pee (urine) clear or pale yellow. °· Try to have someone at home with you for the first 1-2 weeks to help. °· Do not use any products that contain nicotine or tobacco, such as cigarettes and e-cigarettes. These can slow down healing. If you need help quitting, ask your doctor. °General instructions °· Take over-the-counter and prescription medicines only as told by your doctor. °· Do not take aspirin or ibuprofen. These medicines can cause bleeding. °· To prevent or treat constipation while you are taking prescription pain medicine, your doctor may suggest that you: °? Drink enough fluid to keep your urine clear or pale yellow. °? Take over-the-counter or prescription medicines. °? Eat foods that are high in fiber, such as: °§ Fresh fruits and vegetables. °§ Whole grains. °§ Beans. °? Limit foods that are high in fat and processed sugars, such as fried and sweet foods. °· Keep all follow-up visits as told by your doctor. This is important. °Contact a doctor if: °· You have chills or fever. °· You have redness, swelling, or pain around your cut. °· You have fluid or blood coming from your cut. °· Your cut feels warm to the touch. °· You have pus or a bad smell coming from your cut. °· Your cut breaks   open. °· You feel dizzy or light-headed. °· You have pain or bleeding when you pee. °· You keep having watery poop (diarrhea). °· You keep feeling sick to your stomach (nauseous) or keep throwing up (vomiting). °· You have unusual fluid (discharge) coming from your vagina. °· You have a rash. °· You have a reaction to your medicine. °· Your pain medicine does not help. °Get help right away if: °· You have a fever and your symptoms get worse all of a sudden. °· You have very bad belly (abdominal) pain. °· You are short of breath. °· You pass out (faint). °· You have pain, swelling, or redness of your  leg. °· You bleed a lot from your vagina and notice clumps of blood (clots). °Summary °· Do not take baths, swim, or use a hot tub until your doctor says it is okay. Ask your doctor if you can take showers. You may only be allowed to take sponge baths for bathing. °· Follow your doctor's advice about exercise, driving, and general activities. Ask your doctor what activities are safe for you. °· Do not lift anything that is heavier than 10 lb (4.5 kg), or the limit that your doctor tells you, until he or she says that it is safe. °· Try to have someone at home with you for the first 1-2 weeks to help. °This information is not intended to replace advice given to you by your health care provider. Make sure you discuss any questions you have with your health care provider. °Document Released: 10/12/2007 Document Revised: 12/22/2015 Document Reviewed: 12/22/2015 °Elsevier Interactive Patient Education © 2017 [employer] ° °

## 2016-09-27 NOTE — Progress Notes (Signed)
Discharge instructions given to patient and she verbalized understanding of all instructions provided.

## 2016-10-02 ENCOUNTER — Telehealth: Payer: Self-pay | Admitting: General Practice

## 2016-10-02 NOTE — Telephone Encounter (Signed)
Called and left message on VM in regards to post op appointment with Dr. Kennon Rounds on 10/18/16 at 8:00am.  Asked patient to give our office a call if unable to keep this appointment.

## 2016-10-18 ENCOUNTER — Ambulatory Visit (INDEPENDENT_AMBULATORY_CARE_PROVIDER_SITE_OTHER): Payer: Medicare Other | Admitting: Family Medicine

## 2016-10-18 ENCOUNTER — Encounter: Payer: Self-pay | Admitting: Family Medicine

## 2016-10-18 VITALS — BP 127/74 | HR 79 | Ht 64.0 in | Wt 195.1 lb

## 2016-10-18 DIAGNOSIS — Z09 Encounter for follow-up examination after completed treatment for conditions other than malignant neoplasm: Secondary | ICD-10-CM

## 2016-10-18 NOTE — Progress Notes (Signed)
   Subjective:    Patient ID: Tanya Nunez is a 66 y.o. female presenting with Routine Post Op  on 10/18/2016  HPI: S/p TVH and Bil salpingectomy on 9/11. Doing well. No fever, chills. Not taking pain meds.  Review of Systems  Constitutional: Negative for chills and fever.  Respiratory: Negative for shortness of breath.   Cardiovascular: Negative for chest pain.  Gastrointestinal: Negative for abdominal pain, nausea and vomiting.  Genitourinary: Negative for dysuria.  Skin: Negative for rash.      Objective:    BP 127/74   Pulse 79   Ht 5\' 4"  (1.626 m)   Wt 195 lb 1.6 oz (88.5 kg)   BMI 33.49 kg/m  Physical Exam  Constitutional: She is oriented to person, place, and time. She appears well-developed and well-nourished. No distress.  HENT:  Head: Normocephalic and atraumatic.  Eyes: No scleral icterus.  Neck: Neck supple.  Cardiovascular: Normal rate.   Pulmonary/Chest: Effort normal.  Abdominal: Soft.  Genitourinary:  Genitourinary Comments: Vagina is healing well. Sutures noted at top of cuff. Non-tender  Neurological: She is alert and oriented to person, place, and time.  Skin: Skin is warm and dry.  Psychiatric: She has a normal mood and affect.        Assessment & Plan:  Postop check - questions answered. Limitation reviewed.   Return in about 4 weeks (around 11/15/2016).  Tanya Nunez 10/18/2016 8:21 AM

## 2016-11-02 DIAGNOSIS — E785 Hyperlipidemia, unspecified: Secondary | ICD-10-CM | POA: Diagnosis not present

## 2016-11-02 DIAGNOSIS — M19011 Primary osteoarthritis, right shoulder: Secondary | ICD-10-CM | POA: Diagnosis not present

## 2016-11-02 DIAGNOSIS — E669 Obesity, unspecified: Secondary | ICD-10-CM | POA: Diagnosis not present

## 2016-11-02 DIAGNOSIS — I10 Essential (primary) hypertension: Secondary | ICD-10-CM | POA: Diagnosis not present

## 2016-11-02 DIAGNOSIS — Z23 Encounter for immunization: Secondary | ICD-10-CM | POA: Diagnosis not present

## 2016-11-02 DIAGNOSIS — E559 Vitamin D deficiency, unspecified: Secondary | ICD-10-CM | POA: Diagnosis not present

## 2016-11-02 DIAGNOSIS — N289 Disorder of kidney and ureter, unspecified: Secondary | ICD-10-CM | POA: Diagnosis not present

## 2016-11-15 ENCOUNTER — Encounter: Payer: Self-pay | Admitting: Family Medicine

## 2016-11-15 ENCOUNTER — Ambulatory Visit (INDEPENDENT_AMBULATORY_CARE_PROVIDER_SITE_OTHER): Payer: Medicare Other | Admitting: Family Medicine

## 2016-11-15 VITALS — BP 143/86 | HR 91 | Wt 191.0 lb

## 2016-11-15 DIAGNOSIS — Z09 Encounter for follow-up examination after completed treatment for conditions other than malignant neoplasm: Secondary | ICD-10-CM

## 2016-11-16 NOTE — Progress Notes (Signed)
   Subjective:    Patient ID: Tanya Nunez is a 66 y.o. female presenting with No chief complaint on file.  on 11/15/2016  HPI: Here for post op check. S/p TVH for fibroids and postmenopausal bleeding unresponsive to po meds on 09/26/16. Denies fever, chills, bleeding.  Review of Systems  Constitutional: Negative for chills and fever.  Respiratory: Negative for shortness of breath.   Cardiovascular: Negative for chest pain.  Gastrointestinal: Negative for abdominal pain, nausea and vomiting.  Genitourinary: Negative for dysuria.  Skin: Negative for rash.      Objective:    BP (!) 143/86   Pulse 91   Wt 191 lb (86.6 kg)   BMI 32.79 kg/m  Physical Exam  Constitutional: She is oriented to person, place, and time. She appears well-developed and well-nourished. No distress.  HENT:  Head: Normocephalic and atraumatic.  Eyes: No scleral icterus.  Neck: Neck supple.  Cardiovascular: Normal rate.   Pulmonary/Chest: Effort normal.  Abdominal: Soft.  Genitourinary:  Genitourinary Comments: Vaginal cuff is intact.  Neurological: She is alert and oriented to person, place, and time.  Skin: Skin is warm and dry.  Psychiatric: She has a normal mood and affect.        Assessment & Plan:  Postop check - doing well--released back to full activity  Return if symptoms worsen or fail to improve.  [name] 11/16/2016 7:26 AM

## 2017-02-08 DIAGNOSIS — I10 Essential (primary) hypertension: Secondary | ICD-10-CM | POA: Diagnosis not present

## 2017-02-08 DIAGNOSIS — E559 Vitamin D deficiency, unspecified: Secondary | ICD-10-CM | POA: Diagnosis not present

## 2017-02-08 DIAGNOSIS — E785 Hyperlipidemia, unspecified: Secondary | ICD-10-CM | POA: Diagnosis not present

## 2017-02-08 DIAGNOSIS — M19011 Primary osteoarthritis, right shoulder: Secondary | ICD-10-CM | POA: Diagnosis not present

## 2017-02-08 DIAGNOSIS — E669 Obesity, unspecified: Secondary | ICD-10-CM | POA: Diagnosis not present

## 2017-02-08 DIAGNOSIS — N289 Disorder of kidney and ureter, unspecified: Secondary | ICD-10-CM | POA: Diagnosis not present

## 2017-02-20 DIAGNOSIS — E669 Obesity, unspecified: Secondary | ICD-10-CM | POA: Diagnosis not present

## 2017-02-20 DIAGNOSIS — Z131 Encounter for screening for diabetes mellitus: Secondary | ICD-10-CM | POA: Diagnosis not present

## 2017-02-20 DIAGNOSIS — E785 Hyperlipidemia, unspecified: Secondary | ICD-10-CM | POA: Diagnosis not present

## 2017-02-20 DIAGNOSIS — M19011 Primary osteoarthritis, right shoulder: Secondary | ICD-10-CM | POA: Diagnosis not present

## 2017-02-20 DIAGNOSIS — E559 Vitamin D deficiency, unspecified: Secondary | ICD-10-CM | POA: Diagnosis not present

## 2017-02-20 DIAGNOSIS — Z0110 Encounter for examination of ears and hearing without abnormal findings: Secondary | ICD-10-CM | POA: Diagnosis not present

## 2017-02-20 DIAGNOSIS — N289 Disorder of kidney and ureter, unspecified: Secondary | ICD-10-CM | POA: Diagnosis not present

## 2017-02-20 DIAGNOSIS — I10 Essential (primary) hypertension: Secondary | ICD-10-CM | POA: Diagnosis not present

## 2017-02-20 DIAGNOSIS — Z0000 Encounter for general adult medical examination without abnormal findings: Secondary | ICD-10-CM | POA: Diagnosis not present

## 2017-02-20 DIAGNOSIS — E119 Type 2 diabetes mellitus without complications: Secondary | ICD-10-CM | POA: Diagnosis not present

## 2017-04-03 DIAGNOSIS — N289 Disorder of kidney and ureter, unspecified: Secondary | ICD-10-CM | POA: Diagnosis not present

## 2017-04-03 DIAGNOSIS — E669 Obesity, unspecified: Secondary | ICD-10-CM | POA: Diagnosis not present

## 2017-04-03 DIAGNOSIS — M19011 Primary osteoarthritis, right shoulder: Secondary | ICD-10-CM | POA: Diagnosis not present

## 2017-04-03 DIAGNOSIS — E559 Vitamin D deficiency, unspecified: Secondary | ICD-10-CM | POA: Diagnosis not present

## 2017-04-03 DIAGNOSIS — E785 Hyperlipidemia, unspecified: Secondary | ICD-10-CM | POA: Diagnosis not present

## 2017-04-03 DIAGNOSIS — E119 Type 2 diabetes mellitus without complications: Secondary | ICD-10-CM | POA: Diagnosis not present

## 2017-04-03 DIAGNOSIS — I10 Essential (primary) hypertension: Secondary | ICD-10-CM | POA: Diagnosis not present

## 2017-04-05 DIAGNOSIS — R05 Cough: Secondary | ICD-10-CM | POA: Diagnosis not present

## 2017-04-05 DIAGNOSIS — E119 Type 2 diabetes mellitus without complications: Secondary | ICD-10-CM | POA: Diagnosis not present

## 2017-04-05 DIAGNOSIS — E669 Obesity, unspecified: Secondary | ICD-10-CM | POA: Diagnosis not present

## 2017-04-05 DIAGNOSIS — E785 Hyperlipidemia, unspecified: Secondary | ICD-10-CM | POA: Diagnosis not present

## 2017-04-05 DIAGNOSIS — M19011 Primary osteoarthritis, right shoulder: Secondary | ICD-10-CM | POA: Diagnosis not present

## 2017-04-05 DIAGNOSIS — N289 Disorder of kidney and ureter, unspecified: Secondary | ICD-10-CM | POA: Diagnosis not present

## 2017-04-05 DIAGNOSIS — I10 Essential (primary) hypertension: Secondary | ICD-10-CM | POA: Diagnosis not present

## 2017-04-05 DIAGNOSIS — E559 Vitamin D deficiency, unspecified: Secondary | ICD-10-CM | POA: Diagnosis not present

## 2017-04-17 DIAGNOSIS — M19011 Primary osteoarthritis, right shoulder: Secondary | ICD-10-CM | POA: Diagnosis not present

## 2017-04-17 DIAGNOSIS — E669 Obesity, unspecified: Secondary | ICD-10-CM | POA: Diagnosis not present

## 2017-04-17 DIAGNOSIS — E559 Vitamin D deficiency, unspecified: Secondary | ICD-10-CM | POA: Diagnosis not present

## 2017-04-17 DIAGNOSIS — R05 Cough: Secondary | ICD-10-CM | POA: Diagnosis not present

## 2017-04-17 DIAGNOSIS — N289 Disorder of kidney and ureter, unspecified: Secondary | ICD-10-CM | POA: Diagnosis not present

## 2017-04-17 DIAGNOSIS — E119 Type 2 diabetes mellitus without complications: Secondary | ICD-10-CM | POA: Diagnosis not present

## 2017-04-17 DIAGNOSIS — I10 Essential (primary) hypertension: Secondary | ICD-10-CM | POA: Diagnosis not present

## 2017-04-17 DIAGNOSIS — E785 Hyperlipidemia, unspecified: Secondary | ICD-10-CM | POA: Diagnosis not present

## 2017-05-08 DIAGNOSIS — M19011 Primary osteoarthritis, right shoulder: Secondary | ICD-10-CM | POA: Diagnosis not present

## 2017-05-08 DIAGNOSIS — E669 Obesity, unspecified: Secondary | ICD-10-CM | POA: Diagnosis not present

## 2017-05-08 DIAGNOSIS — E119 Type 2 diabetes mellitus without complications: Secondary | ICD-10-CM | POA: Diagnosis not present

## 2017-05-08 DIAGNOSIS — E559 Vitamin D deficiency, unspecified: Secondary | ICD-10-CM | POA: Diagnosis not present

## 2017-05-08 DIAGNOSIS — I10 Essential (primary) hypertension: Secondary | ICD-10-CM | POA: Diagnosis not present

## 2017-05-08 DIAGNOSIS — N289 Disorder of kidney and ureter, unspecified: Secondary | ICD-10-CM | POA: Diagnosis not present

## 2017-05-08 DIAGNOSIS — E785 Hyperlipidemia, unspecified: Secondary | ICD-10-CM | POA: Diagnosis not present

## 2017-05-29 DIAGNOSIS — E785 Hyperlipidemia, unspecified: Secondary | ICD-10-CM | POA: Diagnosis not present

## 2017-05-29 DIAGNOSIS — N289 Disorder of kidney and ureter, unspecified: Secondary | ICD-10-CM | POA: Diagnosis not present

## 2017-05-29 DIAGNOSIS — E119 Type 2 diabetes mellitus without complications: Secondary | ICD-10-CM | POA: Diagnosis not present

## 2017-05-29 DIAGNOSIS — E669 Obesity, unspecified: Secondary | ICD-10-CM | POA: Diagnosis not present

## 2017-05-29 DIAGNOSIS — I10 Essential (primary) hypertension: Secondary | ICD-10-CM | POA: Diagnosis not present

## 2017-05-29 DIAGNOSIS — E559 Vitamin D deficiency, unspecified: Secondary | ICD-10-CM | POA: Diagnosis not present

## 2017-05-29 DIAGNOSIS — M19011 Primary osteoarthritis, right shoulder: Secondary | ICD-10-CM | POA: Diagnosis not present

## 2017-05-29 DIAGNOSIS — Z0000 Encounter for general adult medical examination without abnormal findings: Secondary | ICD-10-CM | POA: Diagnosis not present

## 2017-08-28 DIAGNOSIS — E669 Obesity, unspecified: Secondary | ICD-10-CM | POA: Diagnosis not present

## 2017-08-28 DIAGNOSIS — E119 Type 2 diabetes mellitus without complications: Secondary | ICD-10-CM | POA: Diagnosis not present

## 2017-08-28 DIAGNOSIS — E559 Vitamin D deficiency, unspecified: Secondary | ICD-10-CM | POA: Diagnosis not present

## 2017-08-28 DIAGNOSIS — M19011 Primary osteoarthritis, right shoulder: Secondary | ICD-10-CM | POA: Diagnosis not present

## 2017-08-28 DIAGNOSIS — E785 Hyperlipidemia, unspecified: Secondary | ICD-10-CM | POA: Diagnosis not present

## 2017-08-28 DIAGNOSIS — N289 Disorder of kidney and ureter, unspecified: Secondary | ICD-10-CM | POA: Diagnosis not present

## 2017-08-28 DIAGNOSIS — I10 Essential (primary) hypertension: Secondary | ICD-10-CM | POA: Diagnosis not present

## 2017-09-27 DIAGNOSIS — E119 Type 2 diabetes mellitus without complications: Secondary | ICD-10-CM | POA: Diagnosis not present

## 2017-09-27 DIAGNOSIS — E785 Hyperlipidemia, unspecified: Secondary | ICD-10-CM | POA: Diagnosis not present

## 2017-09-27 DIAGNOSIS — E669 Obesity, unspecified: Secondary | ICD-10-CM | POA: Diagnosis not present

## 2017-09-27 DIAGNOSIS — E559 Vitamin D deficiency, unspecified: Secondary | ICD-10-CM | POA: Diagnosis not present

## 2017-09-27 DIAGNOSIS — I10 Essential (primary) hypertension: Secondary | ICD-10-CM | POA: Diagnosis not present

## 2017-09-27 DIAGNOSIS — N289 Disorder of kidney and ureter, unspecified: Secondary | ICD-10-CM | POA: Diagnosis not present

## 2017-09-27 DIAGNOSIS — M19011 Primary osteoarthritis, right shoulder: Secondary | ICD-10-CM | POA: Diagnosis not present

## 2017-10-03 DIAGNOSIS — H25813 Combined forms of age-related cataract, bilateral: Secondary | ICD-10-CM | POA: Diagnosis not present

## 2017-10-03 DIAGNOSIS — H5213 Myopia, bilateral: Secondary | ICD-10-CM | POA: Diagnosis not present

## 2017-10-24 DIAGNOSIS — Z01818 Encounter for other preprocedural examination: Secondary | ICD-10-CM | POA: Diagnosis not present

## 2017-10-24 DIAGNOSIS — H2511 Age-related nuclear cataract, right eye: Secondary | ICD-10-CM | POA: Diagnosis not present

## 2017-10-24 DIAGNOSIS — H25811 Combined forms of age-related cataract, right eye: Secondary | ICD-10-CM | POA: Diagnosis not present

## 2017-11-05 DIAGNOSIS — H25812 Combined forms of age-related cataract, left eye: Secondary | ICD-10-CM | POA: Diagnosis not present

## 2017-11-05 DIAGNOSIS — H2512 Age-related nuclear cataract, left eye: Secondary | ICD-10-CM | POA: Diagnosis not present

## 2017-11-05 DIAGNOSIS — H18232 Secondary corneal edema, left eye: Secondary | ICD-10-CM | POA: Diagnosis not present

## 2017-11-05 DIAGNOSIS — Z961 Presence of intraocular lens: Secondary | ICD-10-CM | POA: Diagnosis not present

## 2017-11-12 DIAGNOSIS — M1990 Unspecified osteoarthritis, unspecified site: Secondary | ICD-10-CM | POA: Diagnosis not present

## 2017-11-12 DIAGNOSIS — I444 Left anterior fascicular block: Secondary | ICD-10-CM | POA: Diagnosis not present

## 2017-11-12 DIAGNOSIS — E871 Hypo-osmolality and hyponatremia: Secondary | ICD-10-CM | POA: Diagnosis not present

## 2017-11-12 DIAGNOSIS — B379 Candidiasis, unspecified: Secondary | ICD-10-CM | POA: Diagnosis not present

## 2017-11-12 DIAGNOSIS — E873 Alkalosis: Secondary | ICD-10-CM | POA: Diagnosis not present

## 2017-11-12 DIAGNOSIS — R1310 Dysphagia, unspecified: Secondary | ICD-10-CM | POA: Diagnosis not present

## 2017-11-12 DIAGNOSIS — I10 Essential (primary) hypertension: Secondary | ICD-10-CM | POA: Diagnosis not present

## 2017-11-12 DIAGNOSIS — E1165 Type 2 diabetes mellitus with hyperglycemia: Secondary | ICD-10-CM | POA: Diagnosis not present

## 2017-11-12 DIAGNOSIS — M109 Gout, unspecified: Secondary | ICD-10-CM | POA: Diagnosis not present

## 2017-11-12 DIAGNOSIS — B370 Candidal stomatitis: Secondary | ICD-10-CM | POA: Diagnosis not present

## 2017-11-12 DIAGNOSIS — E860 Dehydration: Secondary | ICD-10-CM | POA: Diagnosis not present

## 2017-11-12 DIAGNOSIS — Z7982 Long term (current) use of aspirin: Secondary | ICD-10-CM | POA: Diagnosis not present

## 2017-11-13 DIAGNOSIS — E1165 Type 2 diabetes mellitus with hyperglycemia: Secondary | ICD-10-CM | POA: Diagnosis not present

## 2017-11-13 DIAGNOSIS — I10 Essential (primary) hypertension: Secondary | ICD-10-CM | POA: Diagnosis not present

## 2017-11-13 DIAGNOSIS — B370 Candidal stomatitis: Secondary | ICD-10-CM | POA: Diagnosis not present

## 2017-11-13 DIAGNOSIS — E871 Hypo-osmolality and hyponatremia: Secondary | ICD-10-CM | POA: Diagnosis not present

## 2017-11-13 DIAGNOSIS — I444 Left anterior fascicular block: Secondary | ICD-10-CM | POA: Diagnosis not present

## 2017-11-14 DIAGNOSIS — I10 Essential (primary) hypertension: Secondary | ICD-10-CM | POA: Diagnosis not present

## 2017-11-14 DIAGNOSIS — B379 Candidiasis, unspecified: Secondary | ICD-10-CM | POA: Diagnosis not present

## 2017-11-14 DIAGNOSIS — E871 Hypo-osmolality and hyponatremia: Secondary | ICD-10-CM | POA: Diagnosis not present

## 2017-11-14 DIAGNOSIS — E1165 Type 2 diabetes mellitus with hyperglycemia: Secondary | ICD-10-CM | POA: Diagnosis not present

## 2017-11-15 DIAGNOSIS — E871 Hypo-osmolality and hyponatremia: Secondary | ICD-10-CM | POA: Diagnosis not present

## 2017-11-15 DIAGNOSIS — B379 Candidiasis, unspecified: Secondary | ICD-10-CM | POA: Diagnosis not present

## 2017-11-15 DIAGNOSIS — I10 Essential (primary) hypertension: Secondary | ICD-10-CM | POA: Diagnosis not present

## 2017-11-15 DIAGNOSIS — E1165 Type 2 diabetes mellitus with hyperglycemia: Secondary | ICD-10-CM | POA: Diagnosis not present

## 2017-11-20 DIAGNOSIS — N289 Disorder of kidney and ureter, unspecified: Secondary | ICD-10-CM | POA: Diagnosis not present

## 2017-11-20 DIAGNOSIS — E559 Vitamin D deficiency, unspecified: Secondary | ICD-10-CM | POA: Diagnosis not present

## 2017-11-20 DIAGNOSIS — E119 Type 2 diabetes mellitus without complications: Secondary | ICD-10-CM | POA: Diagnosis not present

## 2017-11-20 DIAGNOSIS — M19011 Primary osteoarthritis, right shoulder: Secondary | ICD-10-CM | POA: Diagnosis not present

## 2017-11-20 DIAGNOSIS — I10 Essential (primary) hypertension: Secondary | ICD-10-CM | POA: Diagnosis not present

## 2017-11-20 DIAGNOSIS — E785 Hyperlipidemia, unspecified: Secondary | ICD-10-CM | POA: Diagnosis not present

## 2017-11-20 DIAGNOSIS — E669 Obesity, unspecified: Secondary | ICD-10-CM | POA: Diagnosis not present

## 2017-11-27 DIAGNOSIS — E785 Hyperlipidemia, unspecified: Secondary | ICD-10-CM | POA: Diagnosis not present

## 2017-11-27 DIAGNOSIS — I10 Essential (primary) hypertension: Secondary | ICD-10-CM | POA: Diagnosis not present

## 2017-11-27 DIAGNOSIS — M19011 Primary osteoarthritis, right shoulder: Secondary | ICD-10-CM | POA: Diagnosis not present

## 2017-11-27 DIAGNOSIS — E119 Type 2 diabetes mellitus without complications: Secondary | ICD-10-CM | POA: Diagnosis not present

## 2017-11-27 DIAGNOSIS — E669 Obesity, unspecified: Secondary | ICD-10-CM | POA: Diagnosis not present

## 2017-11-27 DIAGNOSIS — E559 Vitamin D deficiency, unspecified: Secondary | ICD-10-CM | POA: Diagnosis not present

## 2017-11-27 DIAGNOSIS — N289 Disorder of kidney and ureter, unspecified: Secondary | ICD-10-CM | POA: Diagnosis not present

## 2017-12-11 DIAGNOSIS — N289 Disorder of kidney and ureter, unspecified: Secondary | ICD-10-CM | POA: Diagnosis not present

## 2017-12-11 DIAGNOSIS — I10 Essential (primary) hypertension: Secondary | ICD-10-CM | POA: Diagnosis not present

## 2017-12-11 DIAGNOSIS — E119 Type 2 diabetes mellitus without complications: Secondary | ICD-10-CM | POA: Diagnosis not present

## 2017-12-11 DIAGNOSIS — M19011 Primary osteoarthritis, right shoulder: Secondary | ICD-10-CM | POA: Diagnosis not present

## 2017-12-11 DIAGNOSIS — E559 Vitamin D deficiency, unspecified: Secondary | ICD-10-CM | POA: Diagnosis not present

## 2017-12-11 DIAGNOSIS — E785 Hyperlipidemia, unspecified: Secondary | ICD-10-CM | POA: Diagnosis not present

## 2017-12-11 DIAGNOSIS — E669 Obesity, unspecified: Secondary | ICD-10-CM | POA: Diagnosis not present

## 2018-01-22 DIAGNOSIS — E785 Hyperlipidemia, unspecified: Secondary | ICD-10-CM | POA: Diagnosis not present

## 2018-01-22 DIAGNOSIS — M19011 Primary osteoarthritis, right shoulder: Secondary | ICD-10-CM | POA: Diagnosis not present

## 2018-01-22 DIAGNOSIS — E559 Vitamin D deficiency, unspecified: Secondary | ICD-10-CM | POA: Diagnosis not present

## 2018-01-22 DIAGNOSIS — E119 Type 2 diabetes mellitus without complications: Secondary | ICD-10-CM | POA: Diagnosis not present

## 2018-01-22 DIAGNOSIS — N289 Disorder of kidney and ureter, unspecified: Secondary | ICD-10-CM | POA: Diagnosis not present

## 2018-01-22 DIAGNOSIS — I10 Essential (primary) hypertension: Secondary | ICD-10-CM | POA: Diagnosis not present

## 2018-01-22 DIAGNOSIS — E669 Obesity, unspecified: Secondary | ICD-10-CM | POA: Diagnosis not present

## 2018-02-21 DIAGNOSIS — Z0110 Encounter for examination of ears and hearing without abnormal findings: Secondary | ICD-10-CM | POA: Diagnosis not present

## 2018-02-21 DIAGNOSIS — E785 Hyperlipidemia, unspecified: Secondary | ICD-10-CM | POA: Diagnosis not present

## 2018-02-21 DIAGNOSIS — E559 Vitamin D deficiency, unspecified: Secondary | ICD-10-CM | POA: Diagnosis not present

## 2018-02-21 DIAGNOSIS — I10 Essential (primary) hypertension: Secondary | ICD-10-CM | POA: Diagnosis not present

## 2018-02-21 DIAGNOSIS — E119 Type 2 diabetes mellitus without complications: Secondary | ICD-10-CM | POA: Diagnosis not present

## 2018-02-21 DIAGNOSIS — N289 Disorder of kidney and ureter, unspecified: Secondary | ICD-10-CM | POA: Diagnosis not present

## 2018-02-21 DIAGNOSIS — E669 Obesity, unspecified: Secondary | ICD-10-CM | POA: Diagnosis not present

## 2018-02-21 DIAGNOSIS — M19011 Primary osteoarthritis, right shoulder: Secondary | ICD-10-CM | POA: Diagnosis not present

## 2018-02-21 DIAGNOSIS — Z794 Long term (current) use of insulin: Secondary | ICD-10-CM | POA: Diagnosis not present

## 2018-02-21 DIAGNOSIS — Z0000 Encounter for general adult medical examination without abnormal findings: Secondary | ICD-10-CM | POA: Diagnosis not present

## 2018-03-22 DIAGNOSIS — M2042 Other hammer toe(s) (acquired), left foot: Secondary | ICD-10-CM | POA: Diagnosis not present

## 2018-03-22 DIAGNOSIS — M21611 Bunion of right foot: Secondary | ICD-10-CM | POA: Diagnosis not present

## 2018-03-22 DIAGNOSIS — M21612 Bunion of left foot: Secondary | ICD-10-CM | POA: Diagnosis not present

## 2018-03-22 DIAGNOSIS — M2041 Other hammer toe(s) (acquired), right foot: Secondary | ICD-10-CM | POA: Diagnosis not present

## 2018-03-22 DIAGNOSIS — E119 Type 2 diabetes mellitus without complications: Secondary | ICD-10-CM | POA: Diagnosis not present

## 2018-05-16 DIAGNOSIS — E559 Vitamin D deficiency, unspecified: Secondary | ICD-10-CM | POA: Diagnosis not present

## 2018-05-16 DIAGNOSIS — Z794 Long term (current) use of insulin: Secondary | ICD-10-CM | POA: Diagnosis not present

## 2018-05-16 DIAGNOSIS — E669 Obesity, unspecified: Secondary | ICD-10-CM | POA: Diagnosis not present

## 2018-05-16 DIAGNOSIS — E119 Type 2 diabetes mellitus without complications: Secondary | ICD-10-CM | POA: Diagnosis not present

## 2018-05-16 DIAGNOSIS — N3001 Acute cystitis with hematuria: Secondary | ICD-10-CM | POA: Diagnosis not present

## 2018-05-16 DIAGNOSIS — N289 Disorder of kidney and ureter, unspecified: Secondary | ICD-10-CM | POA: Diagnosis not present

## 2018-05-16 DIAGNOSIS — E785 Hyperlipidemia, unspecified: Secondary | ICD-10-CM | POA: Diagnosis not present

## 2018-05-16 DIAGNOSIS — I10 Essential (primary) hypertension: Secondary | ICD-10-CM | POA: Diagnosis not present

## 2018-05-16 DIAGNOSIS — M19011 Primary osteoarthritis, right shoulder: Secondary | ICD-10-CM | POA: Diagnosis not present

## 2018-05-30 DIAGNOSIS — Z0000 Encounter for general adult medical examination without abnormal findings: Secondary | ICD-10-CM | POA: Diagnosis not present

## 2018-05-30 DIAGNOSIS — M19011 Primary osteoarthritis, right shoulder: Secondary | ICD-10-CM | POA: Diagnosis not present

## 2018-05-30 DIAGNOSIS — E559 Vitamin D deficiency, unspecified: Secondary | ICD-10-CM | POA: Diagnosis not present

## 2018-05-30 DIAGNOSIS — E119 Type 2 diabetes mellitus without complications: Secondary | ICD-10-CM | POA: Diagnosis not present

## 2018-05-30 DIAGNOSIS — N289 Disorder of kidney and ureter, unspecified: Secondary | ICD-10-CM | POA: Diagnosis not present

## 2018-05-30 DIAGNOSIS — I10 Essential (primary) hypertension: Secondary | ICD-10-CM | POA: Diagnosis not present

## 2018-05-30 DIAGNOSIS — E785 Hyperlipidemia, unspecified: Secondary | ICD-10-CM | POA: Diagnosis not present

## 2018-05-30 DIAGNOSIS — Z794 Long term (current) use of insulin: Secondary | ICD-10-CM | POA: Diagnosis not present

## 2018-05-30 DIAGNOSIS — R1013 Epigastric pain: Secondary | ICD-10-CM | POA: Diagnosis not present

## 2018-05-30 DIAGNOSIS — E669 Obesity, unspecified: Secondary | ICD-10-CM | POA: Diagnosis not present

## 2018-06-07 DIAGNOSIS — I10 Essential (primary) hypertension: Secondary | ICD-10-CM | POA: Diagnosis not present

## 2018-06-07 DIAGNOSIS — E119 Type 2 diabetes mellitus without complications: Secondary | ICD-10-CM | POA: Diagnosis not present

## 2018-06-07 DIAGNOSIS — R809 Proteinuria, unspecified: Secondary | ICD-10-CM | POA: Diagnosis not present

## 2018-06-07 DIAGNOSIS — N390 Urinary tract infection, site not specified: Secondary | ICD-10-CM | POA: Diagnosis not present

## 2018-06-20 DIAGNOSIS — N183 Chronic kidney disease, stage 3 (moderate): Secondary | ICD-10-CM | POA: Diagnosis not present

## 2018-06-20 DIAGNOSIS — I129 Hypertensive chronic kidney disease with stage 1 through stage 4 chronic kidney disease, or unspecified chronic kidney disease: Secondary | ICD-10-CM | POA: Diagnosis not present

## 2018-06-20 DIAGNOSIS — E1122 Type 2 diabetes mellitus with diabetic chronic kidney disease: Secondary | ICD-10-CM | POA: Diagnosis not present

## 2018-06-24 DIAGNOSIS — N183 Chronic kidney disease, stage 3 (moderate): Secondary | ICD-10-CM | POA: Diagnosis not present

## 2018-06-24 DIAGNOSIS — I10 Essential (primary) hypertension: Secondary | ICD-10-CM | POA: Diagnosis not present

## 2018-06-24 DIAGNOSIS — E119 Type 2 diabetes mellitus without complications: Secondary | ICD-10-CM | POA: Diagnosis not present

## 2018-06-27 DIAGNOSIS — E559 Vitamin D deficiency, unspecified: Secondary | ICD-10-CM | POA: Diagnosis not present

## 2018-06-27 DIAGNOSIS — Z794 Long term (current) use of insulin: Secondary | ICD-10-CM | POA: Diagnosis not present

## 2018-06-27 DIAGNOSIS — M19011 Primary osteoarthritis, right shoulder: Secondary | ICD-10-CM | POA: Diagnosis not present

## 2018-06-27 DIAGNOSIS — E669 Obesity, unspecified: Secondary | ICD-10-CM | POA: Diagnosis not present

## 2018-06-27 DIAGNOSIS — N289 Disorder of kidney and ureter, unspecified: Secondary | ICD-10-CM | POA: Diagnosis not present

## 2018-06-27 DIAGNOSIS — E119 Type 2 diabetes mellitus without complications: Secondary | ICD-10-CM | POA: Diagnosis not present

## 2018-06-27 DIAGNOSIS — R1013 Epigastric pain: Secondary | ICD-10-CM | POA: Diagnosis not present

## 2018-06-27 DIAGNOSIS — I10 Essential (primary) hypertension: Secondary | ICD-10-CM | POA: Diagnosis not present

## 2018-06-27 DIAGNOSIS — E785 Hyperlipidemia, unspecified: Secondary | ICD-10-CM | POA: Diagnosis not present

## 2018-06-28 DIAGNOSIS — E119 Type 2 diabetes mellitus without complications: Secondary | ICD-10-CM | POA: Diagnosis not present

## 2018-06-28 DIAGNOSIS — I10 Essential (primary) hypertension: Secondary | ICD-10-CM | POA: Diagnosis not present

## 2018-06-28 DIAGNOSIS — N390 Urinary tract infection, site not specified: Secondary | ICD-10-CM | POA: Diagnosis not present

## 2018-08-02 DIAGNOSIS — K640 First degree hemorrhoids: Secondary | ICD-10-CM | POA: Diagnosis not present

## 2018-08-02 DIAGNOSIS — D124 Benign neoplasm of descending colon: Secondary | ICD-10-CM | POA: Diagnosis not present

## 2018-08-02 DIAGNOSIS — Z1211 Encounter for screening for malignant neoplasm of colon: Secondary | ICD-10-CM | POA: Diagnosis not present

## 2018-08-02 DIAGNOSIS — D126 Benign neoplasm of colon, unspecified: Secondary | ICD-10-CM | POA: Diagnosis not present

## 2018-08-15 DIAGNOSIS — N289 Disorder of kidney and ureter, unspecified: Secondary | ICD-10-CM | POA: Diagnosis not present

## 2018-08-15 DIAGNOSIS — E669 Obesity, unspecified: Secondary | ICD-10-CM | POA: Diagnosis not present

## 2018-08-15 DIAGNOSIS — E785 Hyperlipidemia, unspecified: Secondary | ICD-10-CM | POA: Diagnosis not present

## 2018-08-15 DIAGNOSIS — Z794 Long term (current) use of insulin: Secondary | ICD-10-CM | POA: Diagnosis not present

## 2018-08-15 DIAGNOSIS — I10 Essential (primary) hypertension: Secondary | ICD-10-CM | POA: Diagnosis not present

## 2018-08-15 DIAGNOSIS — E119 Type 2 diabetes mellitus without complications: Secondary | ICD-10-CM | POA: Diagnosis not present

## 2018-08-15 DIAGNOSIS — M19011 Primary osteoarthritis, right shoulder: Secondary | ICD-10-CM | POA: Diagnosis not present

## 2018-08-15 DIAGNOSIS — E559 Vitamin D deficiency, unspecified: Secondary | ICD-10-CM | POA: Diagnosis not present

## 2018-08-30 DIAGNOSIS — E119 Type 2 diabetes mellitus without complications: Secondary | ICD-10-CM | POA: Diagnosis not present

## 2018-08-30 DIAGNOSIS — H25812 Combined forms of age-related cataract, left eye: Secondary | ICD-10-CM | POA: Diagnosis not present

## 2018-09-16 ENCOUNTER — Other Ambulatory Visit (HOSPITAL_BASED_OUTPATIENT_CLINIC_OR_DEPARTMENT_OTHER): Payer: Self-pay | Admitting: Physician Assistant

## 2018-09-16 DIAGNOSIS — Z1231 Encounter for screening mammogram for malignant neoplasm of breast: Secondary | ICD-10-CM

## 2018-09-19 ENCOUNTER — Ambulatory Visit (HOSPITAL_BASED_OUTPATIENT_CLINIC_OR_DEPARTMENT_OTHER)
Admission: RE | Admit: 2018-09-19 | Discharge: 2018-09-19 | Disposition: A | Discharge status: Home / Self Care | Payer: Medicare HMO | Source: Ambulatory Visit | Attending: Physician Assistant | Admitting: Physician Assistant

## 2018-09-19 ENCOUNTER — Encounter (HOSPITAL_BASED_OUTPATIENT_CLINIC_OR_DEPARTMENT_OTHER): Payer: Self-pay

## 2018-09-19 ENCOUNTER — Other Ambulatory Visit: Payer: Self-pay

## 2018-09-19 DIAGNOSIS — Z1159 Encounter for screening for other viral diseases: Secondary | ICD-10-CM | POA: Diagnosis not present

## 2018-09-19 DIAGNOSIS — Z1231 Encounter for screening mammogram for malignant neoplasm of breast: Secondary | ICD-10-CM | POA: Diagnosis not present

## 2018-09-27 DIAGNOSIS — E119 Type 2 diabetes mellitus without complications: Secondary | ICD-10-CM | POA: Diagnosis not present

## 2018-09-27 DIAGNOSIS — M21612 Bunion of left foot: Secondary | ICD-10-CM | POA: Diagnosis not present

## 2018-09-27 DIAGNOSIS — M2041 Other hammer toe(s) (acquired), right foot: Secondary | ICD-10-CM | POA: Diagnosis not present

## 2018-09-27 DIAGNOSIS — M2042 Other hammer toe(s) (acquired), left foot: Secondary | ICD-10-CM | POA: Diagnosis not present

## 2018-09-27 DIAGNOSIS — M21611 Bunion of right foot: Secondary | ICD-10-CM | POA: Diagnosis not present

## 2018-10-04 DIAGNOSIS — I10 Essential (primary) hypertension: Secondary | ICD-10-CM | POA: Diagnosis not present

## 2018-10-04 DIAGNOSIS — E119 Type 2 diabetes mellitus without complications: Secondary | ICD-10-CM | POA: Diagnosis not present

## 2018-10-04 DIAGNOSIS — N183 Chronic kidney disease, stage 3 (moderate): Secondary | ICD-10-CM | POA: Diagnosis not present

## 2018-10-07 DIAGNOSIS — I10 Essential (primary) hypertension: Secondary | ICD-10-CM | POA: Diagnosis not present

## 2018-10-07 DIAGNOSIS — N390 Urinary tract infection, site not specified: Secondary | ICD-10-CM | POA: Diagnosis not present

## 2018-10-07 DIAGNOSIS — E119 Type 2 diabetes mellitus without complications: Secondary | ICD-10-CM | POA: Diagnosis not present

## 2018-10-22 DIAGNOSIS — E785 Hyperlipidemia, unspecified: Secondary | ICD-10-CM | POA: Diagnosis not present

## 2018-10-22 DIAGNOSIS — E559 Vitamin D deficiency, unspecified: Secondary | ICD-10-CM | POA: Diagnosis not present

## 2018-10-22 DIAGNOSIS — Z794 Long term (current) use of insulin: Secondary | ICD-10-CM | POA: Diagnosis not present

## 2018-10-22 DIAGNOSIS — Z23 Encounter for immunization: Secondary | ICD-10-CM | POA: Diagnosis not present

## 2018-10-22 DIAGNOSIS — I10 Essential (primary) hypertension: Secondary | ICD-10-CM | POA: Diagnosis not present

## 2018-10-22 DIAGNOSIS — N289 Disorder of kidney and ureter, unspecified: Secondary | ICD-10-CM | POA: Diagnosis not present

## 2018-10-22 DIAGNOSIS — M19011 Primary osteoarthritis, right shoulder: Secondary | ICD-10-CM | POA: Diagnosis not present

## 2018-10-22 DIAGNOSIS — E669 Obesity, unspecified: Secondary | ICD-10-CM | POA: Diagnosis not present

## 2018-10-22 DIAGNOSIS — E119 Type 2 diabetes mellitus without complications: Secondary | ICD-10-CM | POA: Diagnosis not present

## 2018-10-25 DIAGNOSIS — E119 Type 2 diabetes mellitus without complications: Secondary | ICD-10-CM | POA: Diagnosis not present

## 2018-10-25 DIAGNOSIS — N183 Chronic kidney disease, stage 3 unspecified: Secondary | ICD-10-CM | POA: Diagnosis not present

## 2018-10-25 DIAGNOSIS — I10 Essential (primary) hypertension: Secondary | ICD-10-CM | POA: Diagnosis not present

## 2018-10-28 DIAGNOSIS — E119 Type 2 diabetes mellitus without complications: Secondary | ICD-10-CM | POA: Diagnosis not present

## 2018-10-28 DIAGNOSIS — I10 Essential (primary) hypertension: Secondary | ICD-10-CM | POA: Diagnosis not present

## 2018-10-28 DIAGNOSIS — N390 Urinary tract infection, site not specified: Secondary | ICD-10-CM | POA: Diagnosis not present

## 2018-11-14 DIAGNOSIS — M19011 Primary osteoarthritis, right shoulder: Secondary | ICD-10-CM | POA: Diagnosis not present

## 2018-11-14 DIAGNOSIS — E119 Type 2 diabetes mellitus without complications: Secondary | ICD-10-CM | POA: Diagnosis not present

## 2018-11-14 DIAGNOSIS — E785 Hyperlipidemia, unspecified: Secondary | ICD-10-CM | POA: Diagnosis not present

## 2018-11-14 DIAGNOSIS — E669 Obesity, unspecified: Secondary | ICD-10-CM | POA: Diagnosis not present

## 2018-11-14 DIAGNOSIS — E559 Vitamin D deficiency, unspecified: Secondary | ICD-10-CM | POA: Diagnosis not present

## 2018-11-14 DIAGNOSIS — Z794 Long term (current) use of insulin: Secondary | ICD-10-CM | POA: Diagnosis not present

## 2018-11-14 DIAGNOSIS — N289 Disorder of kidney and ureter, unspecified: Secondary | ICD-10-CM | POA: Diagnosis not present

## 2018-11-14 DIAGNOSIS — I10 Essential (primary) hypertension: Secondary | ICD-10-CM | POA: Diagnosis not present

## 2019-01-14 DIAGNOSIS — E669 Obesity, unspecified: Secondary | ICD-10-CM | POA: Diagnosis not present

## 2019-01-14 DIAGNOSIS — E559 Vitamin D deficiency, unspecified: Secondary | ICD-10-CM | POA: Diagnosis not present

## 2019-01-14 DIAGNOSIS — M19011 Primary osteoarthritis, right shoulder: Secondary | ICD-10-CM | POA: Diagnosis not present

## 2019-01-14 DIAGNOSIS — I10 Essential (primary) hypertension: Secondary | ICD-10-CM | POA: Diagnosis not present

## 2019-01-14 DIAGNOSIS — N289 Disorder of kidney and ureter, unspecified: Secondary | ICD-10-CM | POA: Diagnosis not present

## 2019-01-14 DIAGNOSIS — E119 Type 2 diabetes mellitus without complications: Secondary | ICD-10-CM | POA: Diagnosis not present

## 2019-01-14 DIAGNOSIS — E785 Hyperlipidemia, unspecified: Secondary | ICD-10-CM | POA: Diagnosis not present

## 2019-01-14 DIAGNOSIS — Z794 Long term (current) use of insulin: Secondary | ICD-10-CM | POA: Diagnosis not present

## 2019-01-27 DIAGNOSIS — I10 Essential (primary) hypertension: Secondary | ICD-10-CM | POA: Diagnosis not present

## 2019-01-27 DIAGNOSIS — E119 Type 2 diabetes mellitus without complications: Secondary | ICD-10-CM | POA: Diagnosis not present

## 2019-01-27 DIAGNOSIS — N390 Urinary tract infection, site not specified: Secondary | ICD-10-CM | POA: Diagnosis not present

## 2019-01-27 DIAGNOSIS — N183 Chronic kidney disease, stage 3 unspecified: Secondary | ICD-10-CM | POA: Diagnosis not present

## 2019-02-20 DIAGNOSIS — H5203 Hypermetropia, bilateral: Secondary | ICD-10-CM | POA: Diagnosis not present

## 2019-02-20 DIAGNOSIS — H52209 Unspecified astigmatism, unspecified eye: Secondary | ICD-10-CM | POA: Diagnosis not present

## 2019-02-20 DIAGNOSIS — H524 Presbyopia: Secondary | ICD-10-CM | POA: Diagnosis not present

## 2019-03-20 DIAGNOSIS — N289 Disorder of kidney and ureter, unspecified: Secondary | ICD-10-CM | POA: Diagnosis not present

## 2019-03-20 DIAGNOSIS — E119 Type 2 diabetes mellitus without complications: Secondary | ICD-10-CM | POA: Diagnosis not present

## 2019-03-20 DIAGNOSIS — E785 Hyperlipidemia, unspecified: Secondary | ICD-10-CM | POA: Diagnosis not present

## 2019-03-20 DIAGNOSIS — M19011 Primary osteoarthritis, right shoulder: Secondary | ICD-10-CM | POA: Diagnosis not present

## 2019-03-20 DIAGNOSIS — E669 Obesity, unspecified: Secondary | ICD-10-CM | POA: Diagnosis not present

## 2019-03-20 DIAGNOSIS — Z794 Long term (current) use of insulin: Secondary | ICD-10-CM | POA: Diagnosis not present

## 2019-03-20 DIAGNOSIS — I10 Essential (primary) hypertension: Secondary | ICD-10-CM | POA: Diagnosis not present

## 2019-03-20 DIAGNOSIS — E559 Vitamin D deficiency, unspecified: Secondary | ICD-10-CM | POA: Diagnosis not present

## 2019-04-04 DIAGNOSIS — M2042 Other hammer toe(s) (acquired), left foot: Secondary | ICD-10-CM | POA: Diagnosis not present

## 2019-04-04 DIAGNOSIS — M2041 Other hammer toe(s) (acquired), right foot: Secondary | ICD-10-CM | POA: Diagnosis not present

## 2019-04-04 DIAGNOSIS — M21611 Bunion of right foot: Secondary | ICD-10-CM | POA: Diagnosis not present

## 2019-04-04 DIAGNOSIS — E119 Type 2 diabetes mellitus without complications: Secondary | ICD-10-CM | POA: Diagnosis not present

## 2019-04-04 DIAGNOSIS — M21612 Bunion of left foot: Secondary | ICD-10-CM | POA: Diagnosis not present

## 2019-05-30 DIAGNOSIS — Z131 Encounter for screening for diabetes mellitus: Secondary | ICD-10-CM | POA: Diagnosis not present

## 2019-05-30 DIAGNOSIS — Z1329 Encounter for screening for other suspected endocrine disorder: Secondary | ICD-10-CM | POA: Diagnosis not present

## 2019-05-30 DIAGNOSIS — I10 Essential (primary) hypertension: Secondary | ICD-10-CM | POA: Diagnosis not present

## 2019-05-30 DIAGNOSIS — Z0001 Encounter for general adult medical examination with abnormal findings: Secondary | ICD-10-CM | POA: Diagnosis not present

## 2019-05-30 DIAGNOSIS — Z1389 Encounter for screening for other disorder: Secondary | ICD-10-CM | POA: Diagnosis not present

## 2019-05-30 DIAGNOSIS — E559 Vitamin D deficiency, unspecified: Secondary | ICD-10-CM | POA: Diagnosis not present

## 2019-05-30 DIAGNOSIS — E785 Hyperlipidemia, unspecified: Secondary | ICD-10-CM | POA: Diagnosis not present

## 2019-05-30 DIAGNOSIS — M19011 Primary osteoarthritis, right shoulder: Secondary | ICD-10-CM | POA: Diagnosis not present

## 2019-05-30 DIAGNOSIS — Z794 Long term (current) use of insulin: Secondary | ICD-10-CM | POA: Diagnosis not present

## 2019-05-30 DIAGNOSIS — E1165 Type 2 diabetes mellitus with hyperglycemia: Secondary | ICD-10-CM | POA: Diagnosis not present

## 2019-05-30 DIAGNOSIS — Z136 Encounter for screening for cardiovascular disorders: Secondary | ICD-10-CM | POA: Diagnosis not present

## 2019-06-02 DIAGNOSIS — Z0001 Encounter for general adult medical examination with abnormal findings: Secondary | ICD-10-CM | POA: Diagnosis not present

## 2019-06-02 DIAGNOSIS — E785 Hyperlipidemia, unspecified: Secondary | ICD-10-CM | POA: Diagnosis not present

## 2019-06-02 DIAGNOSIS — E669 Obesity, unspecified: Secondary | ICD-10-CM | POA: Diagnosis not present

## 2019-06-02 DIAGNOSIS — Z794 Long term (current) use of insulin: Secondary | ICD-10-CM | POA: Diagnosis not present

## 2019-06-02 DIAGNOSIS — I10 Essential (primary) hypertension: Secondary | ICD-10-CM | POA: Diagnosis not present

## 2019-06-02 DIAGNOSIS — R05 Cough: Secondary | ICD-10-CM | POA: Diagnosis not present

## 2019-06-02 DIAGNOSIS — E1165 Type 2 diabetes mellitus with hyperglycemia: Secondary | ICD-10-CM | POA: Diagnosis not present

## 2019-06-02 DIAGNOSIS — M19011 Primary osteoarthritis, right shoulder: Secondary | ICD-10-CM | POA: Diagnosis not present

## 2019-07-25 DIAGNOSIS — N183 Chronic kidney disease, stage 3 unspecified: Secondary | ICD-10-CM | POA: Diagnosis not present

## 2019-07-25 DIAGNOSIS — E559 Vitamin D deficiency, unspecified: Secondary | ICD-10-CM | POA: Diagnosis not present

## 2019-07-25 DIAGNOSIS — E119 Type 2 diabetes mellitus without complications: Secondary | ICD-10-CM | POA: Diagnosis not present

## 2019-07-25 DIAGNOSIS — I10 Essential (primary) hypertension: Secondary | ICD-10-CM | POA: Diagnosis not present

## 2019-07-29 DIAGNOSIS — I10 Essential (primary) hypertension: Secondary | ICD-10-CM | POA: Diagnosis not present

## 2019-07-29 DIAGNOSIS — N390 Urinary tract infection, site not specified: Secondary | ICD-10-CM | POA: Diagnosis not present

## 2019-07-29 DIAGNOSIS — E119 Type 2 diabetes mellitus without complications: Secondary | ICD-10-CM | POA: Diagnosis not present

## 2019-10-01 DIAGNOSIS — M19011 Primary osteoarthritis, right shoulder: Secondary | ICD-10-CM | POA: Diagnosis not present

## 2019-10-01 DIAGNOSIS — Z794 Long term (current) use of insulin: Secondary | ICD-10-CM | POA: Diagnosis not present

## 2019-10-01 DIAGNOSIS — E669 Obesity, unspecified: Secondary | ICD-10-CM | POA: Diagnosis not present

## 2019-10-01 DIAGNOSIS — E1165 Type 2 diabetes mellitus with hyperglycemia: Secondary | ICD-10-CM | POA: Diagnosis not present

## 2019-10-01 DIAGNOSIS — E785 Hyperlipidemia, unspecified: Secondary | ICD-10-CM | POA: Diagnosis not present

## 2019-10-01 DIAGNOSIS — I10 Essential (primary) hypertension: Secondary | ICD-10-CM | POA: Diagnosis not present

## 2019-10-06 DIAGNOSIS — M2041 Other hammer toe(s) (acquired), right foot: Secondary | ICD-10-CM | POA: Diagnosis not present

## 2019-10-06 DIAGNOSIS — M21612 Bunion of left foot: Secondary | ICD-10-CM | POA: Diagnosis not present

## 2019-10-06 DIAGNOSIS — E119 Type 2 diabetes mellitus without complications: Secondary | ICD-10-CM | POA: Diagnosis not present

## 2019-10-06 DIAGNOSIS — M21611 Bunion of right foot: Secondary | ICD-10-CM | POA: Diagnosis not present

## 2019-10-06 DIAGNOSIS — M2042 Other hammer toe(s) (acquired), left foot: Secondary | ICD-10-CM | POA: Diagnosis not present

## 2019-10-15 DIAGNOSIS — I10 Essential (primary) hypertension: Secondary | ICD-10-CM | POA: Diagnosis not present

## 2019-10-15 DIAGNOSIS — E669 Obesity, unspecified: Secondary | ICD-10-CM | POA: Diagnosis not present

## 2019-10-15 DIAGNOSIS — E119 Type 2 diabetes mellitus without complications: Secondary | ICD-10-CM | POA: Diagnosis not present

## 2019-10-15 DIAGNOSIS — E782 Mixed hyperlipidemia: Secondary | ICD-10-CM | POA: Diagnosis not present

## 2019-10-15 DIAGNOSIS — M19011 Primary osteoarthritis, right shoulder: Secondary | ICD-10-CM | POA: Diagnosis not present

## 2019-10-28 DIAGNOSIS — E782 Mixed hyperlipidemia: Secondary | ICD-10-CM | POA: Diagnosis not present

## 2019-10-28 DIAGNOSIS — I10 Essential (primary) hypertension: Secondary | ICD-10-CM | POA: Diagnosis not present

## 2019-10-28 DIAGNOSIS — E119 Type 2 diabetes mellitus without complications: Secondary | ICD-10-CM | POA: Diagnosis not present

## 2019-10-28 DIAGNOSIS — M19011 Primary osteoarthritis, right shoulder: Secondary | ICD-10-CM | POA: Diagnosis not present

## 2019-10-28 DIAGNOSIS — Z23 Encounter for immunization: Secondary | ICD-10-CM | POA: Diagnosis not present

## 2019-10-28 DIAGNOSIS — E669 Obesity, unspecified: Secondary | ICD-10-CM | POA: Diagnosis not present

## 2019-12-24 DIAGNOSIS — Z130 Encounter for screening for diseases of the blood and blood-forming organs and certain disorders involving the immune mechanism: Secondary | ICD-10-CM | POA: Diagnosis not present

## 2019-12-24 DIAGNOSIS — E782 Mixed hyperlipidemia: Secondary | ICD-10-CM | POA: Diagnosis not present

## 2019-12-24 DIAGNOSIS — E669 Obesity, unspecified: Secondary | ICD-10-CM | POA: Diagnosis not present

## 2019-12-24 DIAGNOSIS — E1142 Type 2 diabetes mellitus with diabetic polyneuropathy: Secondary | ICD-10-CM | POA: Diagnosis not present

## 2019-12-24 DIAGNOSIS — E119 Type 2 diabetes mellitus without complications: Secondary | ICD-10-CM | POA: Diagnosis not present

## 2019-12-24 DIAGNOSIS — Z1321 Encounter for screening for nutritional disorder: Secondary | ICD-10-CM | POA: Diagnosis not present

## 2019-12-24 DIAGNOSIS — I10 Essential (primary) hypertension: Secondary | ICD-10-CM | POA: Diagnosis not present

## 2019-12-24 DIAGNOSIS — M19011 Primary osteoarthritis, right shoulder: Secondary | ICD-10-CM | POA: Diagnosis not present

## 2020-01-05 DIAGNOSIS — I10 Essential (primary) hypertension: Secondary | ICD-10-CM | POA: Diagnosis not present

## 2020-01-05 DIAGNOSIS — E669 Obesity, unspecified: Secondary | ICD-10-CM | POA: Diagnosis not present

## 2020-01-05 DIAGNOSIS — E782 Mixed hyperlipidemia: Secondary | ICD-10-CM | POA: Diagnosis not present

## 2020-01-05 DIAGNOSIS — E119 Type 2 diabetes mellitus without complications: Secondary | ICD-10-CM | POA: Diagnosis not present

## 2020-01-05 DIAGNOSIS — M19011 Primary osteoarthritis, right shoulder: Secondary | ICD-10-CM | POA: Diagnosis not present

## 2020-01-05 DIAGNOSIS — E1142 Type 2 diabetes mellitus with diabetic polyneuropathy: Secondary | ICD-10-CM | POA: Diagnosis not present

## 2020-01-28 DIAGNOSIS — E119 Type 2 diabetes mellitus without complications: Secondary | ICD-10-CM | POA: Diagnosis not present

## 2020-01-28 DIAGNOSIS — I10 Essential (primary) hypertension: Secondary | ICD-10-CM | POA: Diagnosis not present

## 2020-04-05 DIAGNOSIS — M2041 Other hammer toe(s) (acquired), right foot: Secondary | ICD-10-CM | POA: Diagnosis not present

## 2020-04-05 DIAGNOSIS — E119 Type 2 diabetes mellitus without complications: Secondary | ICD-10-CM | POA: Diagnosis not present

## 2020-04-05 DIAGNOSIS — M2042 Other hammer toe(s) (acquired), left foot: Secondary | ICD-10-CM | POA: Diagnosis not present

## 2020-06-01 DIAGNOSIS — Z0001 Encounter for general adult medical examination with abnormal findings: Secondary | ICD-10-CM | POA: Diagnosis not present

## 2020-06-01 DIAGNOSIS — Z6832 Body mass index (BMI) 32.0-32.9, adult: Secondary | ICD-10-CM | POA: Diagnosis not present

## 2020-06-01 DIAGNOSIS — E119 Type 2 diabetes mellitus without complications: Secondary | ICD-10-CM | POA: Diagnosis not present

## 2020-06-01 DIAGNOSIS — E782 Mixed hyperlipidemia: Secondary | ICD-10-CM | POA: Diagnosis not present

## 2020-06-01 DIAGNOSIS — E6609 Other obesity due to excess calories: Secondary | ICD-10-CM | POA: Diagnosis not present

## 2020-06-01 DIAGNOSIS — I10 Essential (primary) hypertension: Secondary | ICD-10-CM | POA: Diagnosis not present

## 2020-06-07 DIAGNOSIS — H00014 Hordeolum externum left upper eyelid: Secondary | ICD-10-CM | POA: Diagnosis not present

## 2020-06-07 DIAGNOSIS — L509 Urticaria, unspecified: Secondary | ICD-10-CM | POA: Diagnosis not present

## 2020-06-17 DIAGNOSIS — I10 Essential (primary) hypertension: Secondary | ICD-10-CM | POA: Diagnosis not present

## 2020-06-17 DIAGNOSIS — L509 Urticaria, unspecified: Secondary | ICD-10-CM | POA: Diagnosis not present

## 2020-06-17 DIAGNOSIS — Z6832 Body mass index (BMI) 32.0-32.9, adult: Secondary | ICD-10-CM | POA: Diagnosis not present

## 2020-06-17 DIAGNOSIS — E6609 Other obesity due to excess calories: Secondary | ICD-10-CM | POA: Diagnosis not present

## 2020-06-17 DIAGNOSIS — E782 Mixed hyperlipidemia: Secondary | ICD-10-CM | POA: Diagnosis not present

## 2020-06-17 DIAGNOSIS — E119 Type 2 diabetes mellitus without complications: Secondary | ICD-10-CM | POA: Diagnosis not present

## 2020-06-17 DIAGNOSIS — Z0001 Encounter for general adult medical examination with abnormal findings: Secondary | ICD-10-CM | POA: Diagnosis not present

## 2020-06-24 DIAGNOSIS — L509 Urticaria, unspecified: Secondary | ICD-10-CM | POA: Diagnosis not present

## 2020-07-12 DIAGNOSIS — Z833 Family history of diabetes mellitus: Secondary | ICD-10-CM | POA: Diagnosis not present

## 2020-07-12 DIAGNOSIS — Z7982 Long term (current) use of aspirin: Secondary | ICD-10-CM | POA: Diagnosis not present

## 2020-07-12 DIAGNOSIS — E1165 Type 2 diabetes mellitus with hyperglycemia: Secondary | ICD-10-CM | POA: Diagnosis not present

## 2020-07-12 DIAGNOSIS — Z8249 Family history of ischemic heart disease and other diseases of the circulatory system: Secondary | ICD-10-CM | POA: Diagnosis not present

## 2020-07-12 DIAGNOSIS — E669 Obesity, unspecified: Secondary | ICD-10-CM | POA: Diagnosis not present

## 2020-07-12 DIAGNOSIS — Z6831 Body mass index (BMI) 31.0-31.9, adult: Secondary | ICD-10-CM | POA: Diagnosis not present

## 2020-07-12 DIAGNOSIS — J309 Allergic rhinitis, unspecified: Secondary | ICD-10-CM | POA: Diagnosis not present

## 2020-07-12 DIAGNOSIS — I10 Essential (primary) hypertension: Secondary | ICD-10-CM | POA: Diagnosis not present

## 2020-07-12 DIAGNOSIS — Z008 Encounter for other general examination: Secondary | ICD-10-CM | POA: Diagnosis not present

## 2020-07-20 DIAGNOSIS — E6609 Other obesity due to excess calories: Secondary | ICD-10-CM | POA: Diagnosis not present

## 2020-07-20 DIAGNOSIS — I10 Essential (primary) hypertension: Secondary | ICD-10-CM | POA: Diagnosis not present

## 2020-07-20 DIAGNOSIS — E119 Type 2 diabetes mellitus without complications: Secondary | ICD-10-CM | POA: Diagnosis not present

## 2020-07-20 DIAGNOSIS — Z6832 Body mass index (BMI) 32.0-32.9, adult: Secondary | ICD-10-CM | POA: Diagnosis not present

## 2020-07-20 DIAGNOSIS — N1831 Chronic kidney disease, stage 3a: Secondary | ICD-10-CM | POA: Diagnosis not present

## 2020-07-20 DIAGNOSIS — E782 Mixed hyperlipidemia: Secondary | ICD-10-CM | POA: Diagnosis not present

## 2020-07-23 DIAGNOSIS — N183 Chronic kidney disease, stage 3 unspecified: Secondary | ICD-10-CM | POA: Diagnosis not present

## 2020-08-16 DIAGNOSIS — E6609 Other obesity due to excess calories: Secondary | ICD-10-CM | POA: Diagnosis not present

## 2020-08-16 DIAGNOSIS — E119 Type 2 diabetes mellitus without complications: Secondary | ICD-10-CM | POA: Diagnosis not present

## 2020-08-16 DIAGNOSIS — E782 Mixed hyperlipidemia: Secondary | ICD-10-CM | POA: Diagnosis not present

## 2020-08-16 DIAGNOSIS — Z6832 Body mass index (BMI) 32.0-32.9, adult: Secondary | ICD-10-CM | POA: Diagnosis not present

## 2020-08-16 DIAGNOSIS — R21 Rash and other nonspecific skin eruption: Secondary | ICD-10-CM | POA: Diagnosis not present

## 2020-08-16 DIAGNOSIS — N1831 Chronic kidney disease, stage 3a: Secondary | ICD-10-CM | POA: Diagnosis not present

## 2020-08-16 DIAGNOSIS — I10 Essential (primary) hypertension: Secondary | ICD-10-CM | POA: Diagnosis not present

## 2020-08-17 DIAGNOSIS — E119 Type 2 diabetes mellitus without complications: Secondary | ICD-10-CM | POA: Diagnosis not present

## 2020-08-17 DIAGNOSIS — N390 Urinary tract infection, site not specified: Secondary | ICD-10-CM | POA: Diagnosis not present

## 2020-08-17 DIAGNOSIS — I10 Essential (primary) hypertension: Secondary | ICD-10-CM | POA: Diagnosis not present

## 2020-08-19 DIAGNOSIS — L301 Dyshidrosis [pompholyx]: Secondary | ICD-10-CM | POA: Diagnosis not present

## 2020-08-19 DIAGNOSIS — I10 Essential (primary) hypertension: Secondary | ICD-10-CM | POA: Diagnosis not present

## 2020-09-02 ENCOUNTER — Other Ambulatory Visit (HOSPITAL_BASED_OUTPATIENT_CLINIC_OR_DEPARTMENT_OTHER): Payer: Self-pay | Admitting: Internal Medicine

## 2020-09-02 DIAGNOSIS — Z1231 Encounter for screening mammogram for malignant neoplasm of breast: Secondary | ICD-10-CM

## 2020-10-04 ENCOUNTER — Ambulatory Visit (HOSPITAL_BASED_OUTPATIENT_CLINIC_OR_DEPARTMENT_OTHER)
Admission: RE | Admit: 2020-10-04 | Discharge: 2020-10-04 | Disposition: A | Discharge status: Home / Self Care | Payer: Medicare HMO | Source: Ambulatory Visit | Attending: Internal Medicine | Admitting: Internal Medicine

## 2020-10-04 ENCOUNTER — Encounter (HOSPITAL_BASED_OUTPATIENT_CLINIC_OR_DEPARTMENT_OTHER): Payer: Self-pay

## 2020-10-04 ENCOUNTER — Other Ambulatory Visit: Payer: Self-pay

## 2020-10-04 DIAGNOSIS — Z1231 Encounter for screening mammogram for malignant neoplasm of breast: Secondary | ICD-10-CM

## 2020-10-06 DIAGNOSIS — M2042 Other hammer toe(s) (acquired), left foot: Secondary | ICD-10-CM | POA: Diagnosis not present

## 2020-10-06 DIAGNOSIS — M2041 Other hammer toe(s) (acquired), right foot: Secondary | ICD-10-CM | POA: Diagnosis not present

## 2020-10-06 DIAGNOSIS — S92514A Nondisplaced fracture of proximal phalanx of right lesser toe(s), initial encounter for closed fracture: Secondary | ICD-10-CM | POA: Diagnosis not present

## 2020-10-06 DIAGNOSIS — E119 Type 2 diabetes mellitus without complications: Secondary | ICD-10-CM | POA: Diagnosis not present

## 2020-10-20 DIAGNOSIS — E6609 Other obesity due to excess calories: Secondary | ICD-10-CM | POA: Diagnosis not present

## 2020-10-20 DIAGNOSIS — I10 Essential (primary) hypertension: Secondary | ICD-10-CM | POA: Diagnosis not present

## 2020-10-20 DIAGNOSIS — E119 Type 2 diabetes mellitus without complications: Secondary | ICD-10-CM | POA: Diagnosis not present

## 2020-10-20 DIAGNOSIS — Z6832 Body mass index (BMI) 32.0-32.9, adult: Secondary | ICD-10-CM | POA: Diagnosis not present

## 2020-10-20 DIAGNOSIS — E782 Mixed hyperlipidemia: Secondary | ICD-10-CM | POA: Diagnosis not present

## 2020-10-20 DIAGNOSIS — N1831 Chronic kidney disease, stage 3a: Secondary | ICD-10-CM | POA: Diagnosis not present

## 2020-10-20 DIAGNOSIS — Z23 Encounter for immunization: Secondary | ICD-10-CM | POA: Diagnosis not present

## 2020-11-17 DIAGNOSIS — E119 Type 2 diabetes mellitus without complications: Secondary | ICD-10-CM | POA: Diagnosis not present

## 2020-11-17 DIAGNOSIS — L84 Corns and callosities: Secondary | ICD-10-CM | POA: Diagnosis not present

## 2020-12-20 DIAGNOSIS — I10 Essential (primary) hypertension: Secondary | ICD-10-CM | POA: Diagnosis not present

## 2020-12-20 DIAGNOSIS — E1165 Type 2 diabetes mellitus with hyperglycemia: Secondary | ICD-10-CM | POA: Diagnosis not present

## 2020-12-20 DIAGNOSIS — N1831 Chronic kidney disease, stage 3a: Secondary | ICD-10-CM | POA: Diagnosis not present

## 2020-12-20 DIAGNOSIS — E782 Mixed hyperlipidemia: Secondary | ICD-10-CM | POA: Diagnosis not present

## 2020-12-21 DIAGNOSIS — L308 Other specified dermatitis: Secondary | ICD-10-CM | POA: Diagnosis not present

## 2021-02-14 DIAGNOSIS — I10 Essential (primary) hypertension: Secondary | ICD-10-CM | POA: Diagnosis not present

## 2021-02-14 DIAGNOSIS — N390 Urinary tract infection, site not specified: Secondary | ICD-10-CM | POA: Diagnosis not present

## 2021-02-14 DIAGNOSIS — E119 Type 2 diabetes mellitus without complications: Secondary | ICD-10-CM | POA: Diagnosis not present

## 2021-02-14 DIAGNOSIS — N183 Chronic kidney disease, stage 3 unspecified: Secondary | ICD-10-CM | POA: Diagnosis not present

## 2021-04-18 DIAGNOSIS — N1831 Chronic kidney disease, stage 3a: Secondary | ICD-10-CM | POA: Diagnosis not present

## 2021-04-18 DIAGNOSIS — I10 Essential (primary) hypertension: Secondary | ICD-10-CM | POA: Diagnosis not present

## 2021-04-18 DIAGNOSIS — E782 Mixed hyperlipidemia: Secondary | ICD-10-CM | POA: Diagnosis not present

## 2021-04-18 DIAGNOSIS — Z136 Encounter for screening for cardiovascular disorders: Secondary | ICD-10-CM | POA: Diagnosis not present

## 2021-04-18 DIAGNOSIS — E1165 Type 2 diabetes mellitus with hyperglycemia: Secondary | ICD-10-CM | POA: Diagnosis not present

## 2021-04-18 DIAGNOSIS — Z0001 Encounter for general adult medical examination with abnormal findings: Secondary | ICD-10-CM | POA: Diagnosis not present

## 2021-04-18 DIAGNOSIS — Z1329 Encounter for screening for other suspected endocrine disorder: Secondary | ICD-10-CM | POA: Diagnosis not present

## 2021-04-18 DIAGNOSIS — E559 Vitamin D deficiency, unspecified: Secondary | ICD-10-CM | POA: Diagnosis not present

## 2021-05-02 DIAGNOSIS — R600 Localized edema: Secondary | ICD-10-CM | POA: Diagnosis not present

## 2021-05-02 DIAGNOSIS — E1165 Type 2 diabetes mellitus with hyperglycemia: Secondary | ICD-10-CM | POA: Diagnosis not present

## 2021-05-02 DIAGNOSIS — E559 Vitamin D deficiency, unspecified: Secondary | ICD-10-CM | POA: Diagnosis not present

## 2021-05-02 DIAGNOSIS — N1831 Chronic kidney disease, stage 3a: Secondary | ICD-10-CM | POA: Diagnosis not present

## 2021-05-02 DIAGNOSIS — I10 Essential (primary) hypertension: Secondary | ICD-10-CM | POA: Diagnosis not present

## 2021-08-08 DIAGNOSIS — Z6832 Body mass index (BMI) 32.0-32.9, adult: Secondary | ICD-10-CM | POA: Diagnosis not present

## 2021-08-08 DIAGNOSIS — I10 Essential (primary) hypertension: Secondary | ICD-10-CM | POA: Diagnosis not present

## 2021-08-08 DIAGNOSIS — Z0001 Encounter for general adult medical examination with abnormal findings: Secondary | ICD-10-CM | POA: Diagnosis not present

## 2021-08-08 DIAGNOSIS — M25562 Pain in left knee: Secondary | ICD-10-CM | POA: Diagnosis not present

## 2021-08-08 DIAGNOSIS — E6609 Other obesity due to excess calories: Secondary | ICD-10-CM | POA: Diagnosis not present

## 2021-08-08 DIAGNOSIS — E119 Type 2 diabetes mellitus without complications: Secondary | ICD-10-CM | POA: Diagnosis not present

## 2021-08-08 DIAGNOSIS — E782 Mixed hyperlipidemia: Secondary | ICD-10-CM | POA: Diagnosis not present

## 2021-08-15 DIAGNOSIS — I10 Essential (primary) hypertension: Secondary | ICD-10-CM | POA: Diagnosis not present

## 2021-08-15 DIAGNOSIS — E1165 Type 2 diabetes mellitus with hyperglycemia: Secondary | ICD-10-CM | POA: Diagnosis not present

## 2021-08-15 DIAGNOSIS — N183 Chronic kidney disease, stage 3 unspecified: Secondary | ICD-10-CM | POA: Diagnosis not present

## 2021-09-07 DIAGNOSIS — Z6831 Body mass index (BMI) 31.0-31.9, adult: Secondary | ICD-10-CM | POA: Diagnosis not present

## 2021-09-07 DIAGNOSIS — Z833 Family history of diabetes mellitus: Secondary | ICD-10-CM | POA: Diagnosis not present

## 2021-09-07 DIAGNOSIS — M1990 Unspecified osteoarthritis, unspecified site: Secondary | ICD-10-CM | POA: Diagnosis not present

## 2021-09-07 DIAGNOSIS — Z7984 Long term (current) use of oral hypoglycemic drugs: Secondary | ICD-10-CM | POA: Diagnosis not present

## 2021-09-07 DIAGNOSIS — E11620 Type 2 diabetes mellitus with diabetic dermatitis: Secondary | ICD-10-CM | POA: Diagnosis not present

## 2021-09-07 DIAGNOSIS — Z7982 Long term (current) use of aspirin: Secondary | ICD-10-CM | POA: Diagnosis not present

## 2021-09-07 DIAGNOSIS — Z8249 Family history of ischemic heart disease and other diseases of the circulatory system: Secondary | ICD-10-CM | POA: Diagnosis not present

## 2021-09-07 DIAGNOSIS — I10 Essential (primary) hypertension: Secondary | ICD-10-CM | POA: Diagnosis not present

## 2021-09-07 DIAGNOSIS — E669 Obesity, unspecified: Secondary | ICD-10-CM | POA: Diagnosis not present

## 2021-09-07 DIAGNOSIS — Z791 Long term (current) use of non-steroidal anti-inflammatories (NSAID): Secondary | ICD-10-CM | POA: Diagnosis not present

## 2021-09-07 DIAGNOSIS — E1140 Type 2 diabetes mellitus with diabetic neuropathy, unspecified: Secondary | ICD-10-CM | POA: Diagnosis not present

## 2021-09-27 DIAGNOSIS — I10 Essential (primary) hypertension: Secondary | ICD-10-CM | POA: Diagnosis not present

## 2021-09-27 DIAGNOSIS — M25562 Pain in left knee: Secondary | ICD-10-CM | POA: Diagnosis not present

## 2021-09-27 DIAGNOSIS — Z23 Encounter for immunization: Secondary | ICD-10-CM | POA: Diagnosis not present

## 2021-09-27 DIAGNOSIS — E119 Type 2 diabetes mellitus without complications: Secondary | ICD-10-CM | POA: Diagnosis not present

## 2021-09-27 DIAGNOSIS — E782 Mixed hyperlipidemia: Secondary | ICD-10-CM | POA: Diagnosis not present

## 2021-11-25 DIAGNOSIS — M1712 Unilateral primary osteoarthritis, left knee: Secondary | ICD-10-CM | POA: Diagnosis not present

## 2021-11-25 DIAGNOSIS — E1142 Type 2 diabetes mellitus with diabetic polyneuropathy: Secondary | ICD-10-CM | POA: Diagnosis not present

## 2021-11-25 DIAGNOSIS — I10 Essential (primary) hypertension: Secondary | ICD-10-CM | POA: Diagnosis not present

## 2021-11-25 DIAGNOSIS — E119 Type 2 diabetes mellitus without complications: Secondary | ICD-10-CM | POA: Diagnosis not present

## 2021-11-25 DIAGNOSIS — E782 Mixed hyperlipidemia: Secondary | ICD-10-CM | POA: Diagnosis not present

## 2021-12-22 DIAGNOSIS — M109 Gout, unspecified: Secondary | ICD-10-CM | POA: Diagnosis not present

## 2021-12-22 DIAGNOSIS — E119 Type 2 diabetes mellitus without complications: Secondary | ICD-10-CM | POA: Diagnosis not present

## 2021-12-22 DIAGNOSIS — Z9841 Cataract extraction status, right eye: Secondary | ICD-10-CM | POA: Diagnosis not present

## 2021-12-22 DIAGNOSIS — E669 Obesity, unspecified: Secondary | ICD-10-CM | POA: Diagnosis not present

## 2021-12-22 DIAGNOSIS — J302 Other seasonal allergic rhinitis: Secondary | ICD-10-CM | POA: Diagnosis not present

## 2021-12-22 DIAGNOSIS — M2042 Other hammer toe(s) (acquired), left foot: Secondary | ICD-10-CM | POA: Diagnosis not present

## 2021-12-22 DIAGNOSIS — E559 Vitamin D deficiency, unspecified: Secondary | ICD-10-CM | POA: Diagnosis not present

## 2021-12-22 DIAGNOSIS — Z6830 Body mass index (BMI) 30.0-30.9, adult: Secondary | ICD-10-CM | POA: Diagnosis not present

## 2021-12-22 DIAGNOSIS — Z0001 Encounter for general adult medical examination with abnormal findings: Secondary | ICD-10-CM | POA: Diagnosis not present

## 2021-12-22 DIAGNOSIS — M2041 Other hammer toe(s) (acquired), right foot: Secondary | ICD-10-CM | POA: Diagnosis not present

## 2021-12-22 DIAGNOSIS — Z7982 Long term (current) use of aspirin: Secondary | ICD-10-CM | POA: Diagnosis not present

## 2021-12-22 DIAGNOSIS — M21619 Bunion of unspecified foot: Secondary | ICD-10-CM | POA: Diagnosis not present

## 2021-12-29 DIAGNOSIS — N3000 Acute cystitis without hematuria: Secondary | ICD-10-CM | POA: Diagnosis not present

## 2021-12-29 DIAGNOSIS — M1712 Unilateral primary osteoarthritis, left knee: Secondary | ICD-10-CM | POA: Diagnosis not present

## 2021-12-29 DIAGNOSIS — E1142 Type 2 diabetes mellitus with diabetic polyneuropathy: Secondary | ICD-10-CM | POA: Diagnosis not present

## 2021-12-29 DIAGNOSIS — E119 Type 2 diabetes mellitus without complications: Secondary | ICD-10-CM | POA: Diagnosis not present

## 2021-12-29 DIAGNOSIS — E782 Mixed hyperlipidemia: Secondary | ICD-10-CM | POA: Diagnosis not present

## 2021-12-29 DIAGNOSIS — I10 Essential (primary) hypertension: Secondary | ICD-10-CM | POA: Diagnosis not present

## 2022-01-13 DIAGNOSIS — Z112 Encounter for screening for other bacterial diseases: Secondary | ICD-10-CM | POA: Diagnosis not present

## 2022-03-23 DIAGNOSIS — M1712 Unilateral primary osteoarthritis, left knee: Secondary | ICD-10-CM | POA: Diagnosis not present

## 2022-03-23 DIAGNOSIS — E1142 Type 2 diabetes mellitus with diabetic polyneuropathy: Secondary | ICD-10-CM | POA: Diagnosis not present

## 2022-03-23 DIAGNOSIS — Z0001 Encounter for general adult medical examination with abnormal findings: Secondary | ICD-10-CM | POA: Diagnosis not present

## 2022-03-23 DIAGNOSIS — E119 Type 2 diabetes mellitus without complications: Secondary | ICD-10-CM | POA: Diagnosis not present

## 2022-03-23 DIAGNOSIS — E782 Mixed hyperlipidemia: Secondary | ICD-10-CM | POA: Diagnosis not present

## 2022-03-23 DIAGNOSIS — I10 Essential (primary) hypertension: Secondary | ICD-10-CM | POA: Diagnosis not present

## 2022-04-20 DIAGNOSIS — Z833 Family history of diabetes mellitus: Secondary | ICD-10-CM | POA: Diagnosis not present

## 2022-04-20 DIAGNOSIS — M1990 Unspecified osteoarthritis, unspecified site: Secondary | ICD-10-CM | POA: Diagnosis not present

## 2022-04-20 DIAGNOSIS — E1122 Type 2 diabetes mellitus with diabetic chronic kidney disease: Secondary | ICD-10-CM | POA: Diagnosis not present

## 2022-04-20 DIAGNOSIS — E669 Obesity, unspecified: Secondary | ICD-10-CM | POA: Diagnosis not present

## 2022-04-20 DIAGNOSIS — I129 Hypertensive chronic kidney disease with stage 1 through stage 4 chronic kidney disease, or unspecified chronic kidney disease: Secondary | ICD-10-CM | POA: Diagnosis not present

## 2022-04-20 DIAGNOSIS — E785 Hyperlipidemia, unspecified: Secondary | ICD-10-CM | POA: Diagnosis not present

## 2022-04-20 DIAGNOSIS — Z6832 Body mass index (BMI) 32.0-32.9, adult: Secondary | ICD-10-CM | POA: Diagnosis not present

## 2022-04-20 DIAGNOSIS — Z8249 Family history of ischemic heart disease and other diseases of the circulatory system: Secondary | ICD-10-CM | POA: Diagnosis not present

## 2022-04-20 DIAGNOSIS — R600 Localized edema: Secondary | ICD-10-CM | POA: Diagnosis not present

## 2022-04-20 DIAGNOSIS — Z7984 Long term (current) use of oral hypoglycemic drugs: Secondary | ICD-10-CM | POA: Diagnosis not present

## 2022-04-20 DIAGNOSIS — N182 Chronic kidney disease, stage 2 (mild): Secondary | ICD-10-CM | POA: Diagnosis not present

## 2022-04-20 DIAGNOSIS — Z008 Encounter for other general examination: Secondary | ICD-10-CM | POA: Diagnosis not present

## 2022-06-22 DIAGNOSIS — M1712 Unilateral primary osteoarthritis, left knee: Secondary | ICD-10-CM | POA: Diagnosis not present

## 2022-08-01 ENCOUNTER — Other Ambulatory Visit (HOSPITAL_BASED_OUTPATIENT_CLINIC_OR_DEPARTMENT_OTHER): Payer: Self-pay | Admitting: Internal Medicine

## 2022-08-01 DIAGNOSIS — Z1231 Encounter for screening mammogram for malignant neoplasm of breast: Secondary | ICD-10-CM

## 2022-08-10 DIAGNOSIS — I10 Essential (primary) hypertension: Secondary | ICD-10-CM | POA: Diagnosis not present

## 2022-08-10 DIAGNOSIS — E559 Vitamin D deficiency, unspecified: Secondary | ICD-10-CM | POA: Diagnosis not present

## 2022-08-10 DIAGNOSIS — E1142 Type 2 diabetes mellitus with diabetic polyneuropathy: Secondary | ICD-10-CM | POA: Diagnosis not present

## 2022-08-10 DIAGNOSIS — M1712 Unilateral primary osteoarthritis, left knee: Secondary | ICD-10-CM | POA: Diagnosis not present

## 2022-08-10 DIAGNOSIS — Z0001 Encounter for general adult medical examination with abnormal findings: Secondary | ICD-10-CM | POA: Diagnosis not present

## 2022-08-10 DIAGNOSIS — E782 Mixed hyperlipidemia: Secondary | ICD-10-CM | POA: Diagnosis not present

## 2022-08-10 DIAGNOSIS — E119 Type 2 diabetes mellitus without complications: Secondary | ICD-10-CM | POA: Diagnosis not present

## 2022-08-10 DIAGNOSIS — L282 Other prurigo: Secondary | ICD-10-CM | POA: Diagnosis not present

## 2022-08-15 DIAGNOSIS — N183 Chronic kidney disease, stage 3 unspecified: Secondary | ICD-10-CM | POA: Diagnosis not present

## 2022-08-15 DIAGNOSIS — I10 Essential (primary) hypertension: Secondary | ICD-10-CM | POA: Diagnosis not present

## 2022-08-15 DIAGNOSIS — R32 Unspecified urinary incontinence: Secondary | ICD-10-CM | POA: Diagnosis not present

## 2022-08-15 DIAGNOSIS — E119 Type 2 diabetes mellitus without complications: Secondary | ICD-10-CM | POA: Diagnosis not present

## 2022-10-09 ENCOUNTER — Inpatient Hospital Stay (HOSPITAL_BASED_OUTPATIENT_CLINIC_OR_DEPARTMENT_OTHER): Admission: RE | Admit: 2022-10-09 | Payer: Medicare Other | Source: Ambulatory Visit

## 2022-10-12 DIAGNOSIS — E1122 Type 2 diabetes mellitus with diabetic chronic kidney disease: Secondary | ICD-10-CM | POA: Diagnosis not present

## 2022-10-12 DIAGNOSIS — E119 Type 2 diabetes mellitus without complications: Secondary | ICD-10-CM | POA: Diagnosis not present

## 2022-10-12 DIAGNOSIS — E782 Mixed hyperlipidemia: Secondary | ICD-10-CM | POA: Diagnosis not present

## 2022-10-12 DIAGNOSIS — M1712 Unilateral primary osteoarthritis, left knee: Secondary | ICD-10-CM | POA: Diagnosis not present

## 2022-10-12 DIAGNOSIS — E1142 Type 2 diabetes mellitus with diabetic polyneuropathy: Secondary | ICD-10-CM | POA: Diagnosis not present

## 2022-10-12 DIAGNOSIS — N1831 Chronic kidney disease, stage 3a: Secondary | ICD-10-CM | POA: Diagnosis not present

## 2022-10-12 DIAGNOSIS — I10 Essential (primary) hypertension: Secondary | ICD-10-CM | POA: Diagnosis not present

## 2022-10-12 DIAGNOSIS — E559 Vitamin D deficiency, unspecified: Secondary | ICD-10-CM | POA: Diagnosis not present

## 2022-10-19 ENCOUNTER — Encounter (HOSPITAL_BASED_OUTPATIENT_CLINIC_OR_DEPARTMENT_OTHER): Payer: Self-pay

## 2022-10-19 ENCOUNTER — Ambulatory Visit (HOSPITAL_BASED_OUTPATIENT_CLINIC_OR_DEPARTMENT_OTHER)
Admission: RE | Admit: 2022-10-19 | Discharge: 2022-10-19 | Disposition: A | Discharge status: Home / Self Care | Payer: Medicare Other | Source: Ambulatory Visit | Attending: Internal Medicine | Admitting: Internal Medicine

## 2022-10-19 DIAGNOSIS — Z1231 Encounter for screening mammogram for malignant neoplasm of breast: Secondary | ICD-10-CM

## 2022-11-04 IMAGING — MG MM DIGITAL SCREENING BILAT W/ TOMO AND CAD
6 of 10 series · 6 of 30 positions shown · non-contrast
Comparison: Previous exam(s).

CLINICAL DATA: Screening.

EXAM:
DIGITAL SCREENING BILATERAL MAMMOGRAM WITH TOMOSYNTHESIS AND CAD
TECHNIQUE: Bilateral screening digital craniocaudal and mediolateral oblique
mammograms were obtained. Bilateral screening digital breast
tomosynthesis was performed. The images were evaluated with
computer-aided detection.

[L CC synth-2D]
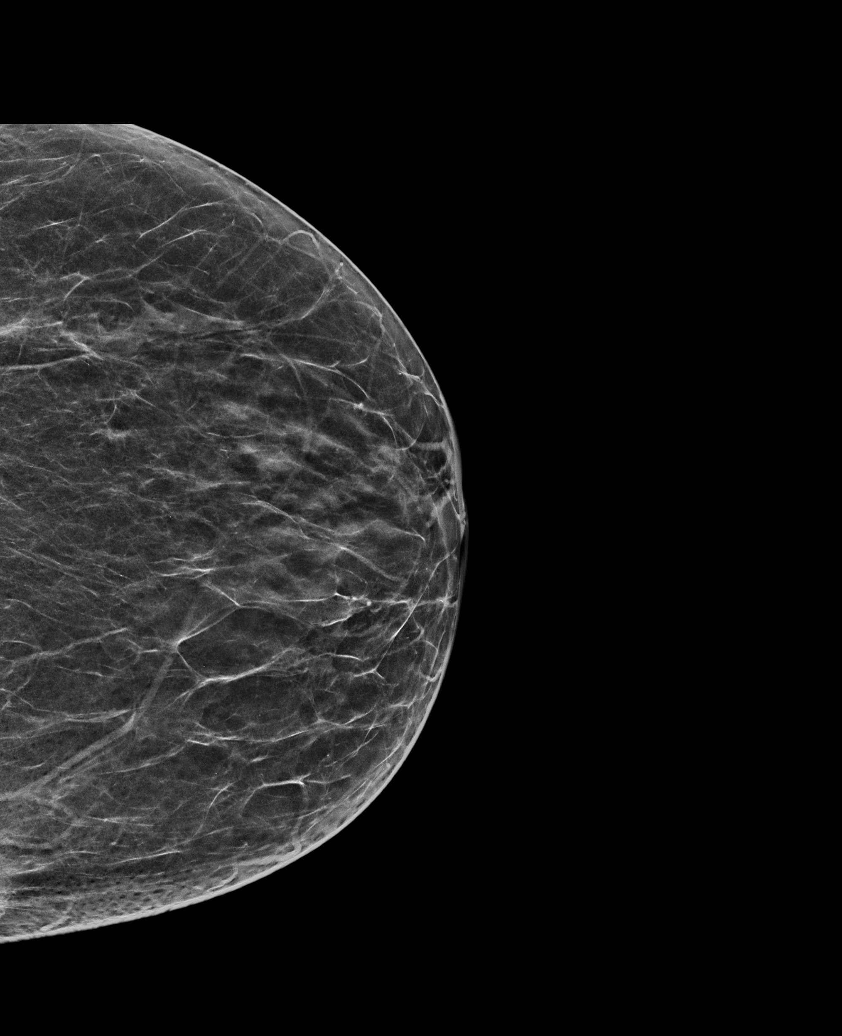

[R CC synth-2D]
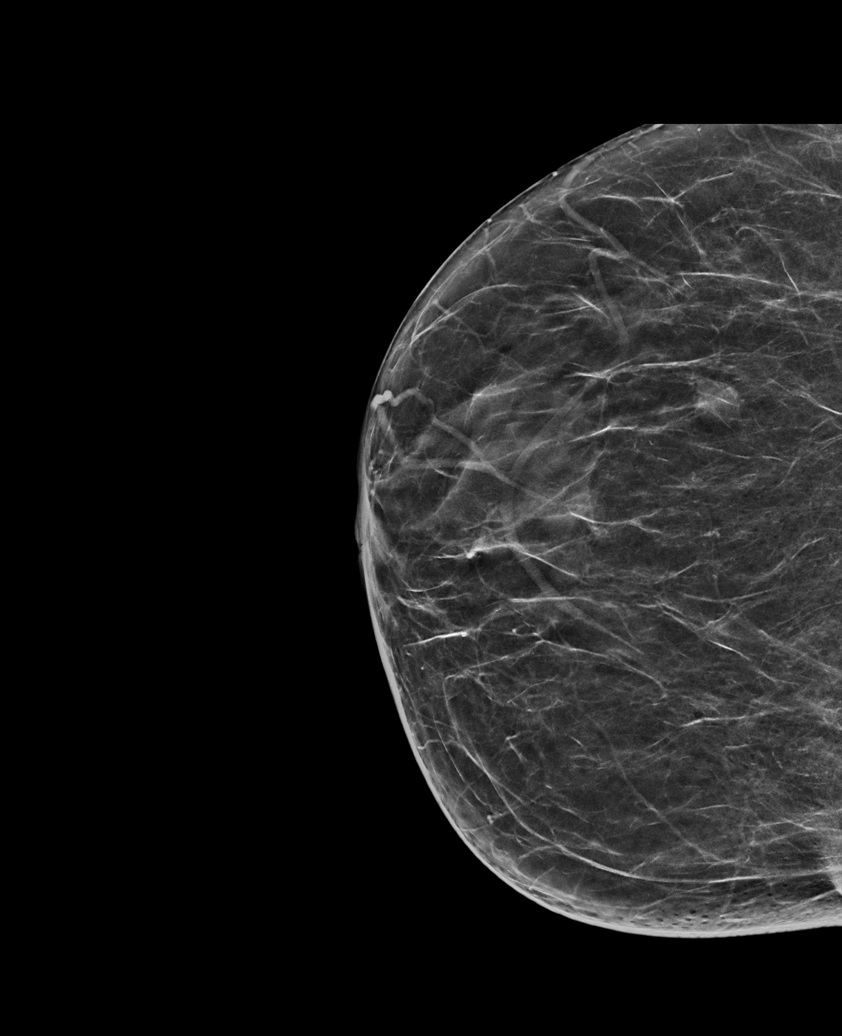

[L MLO synth-2D]
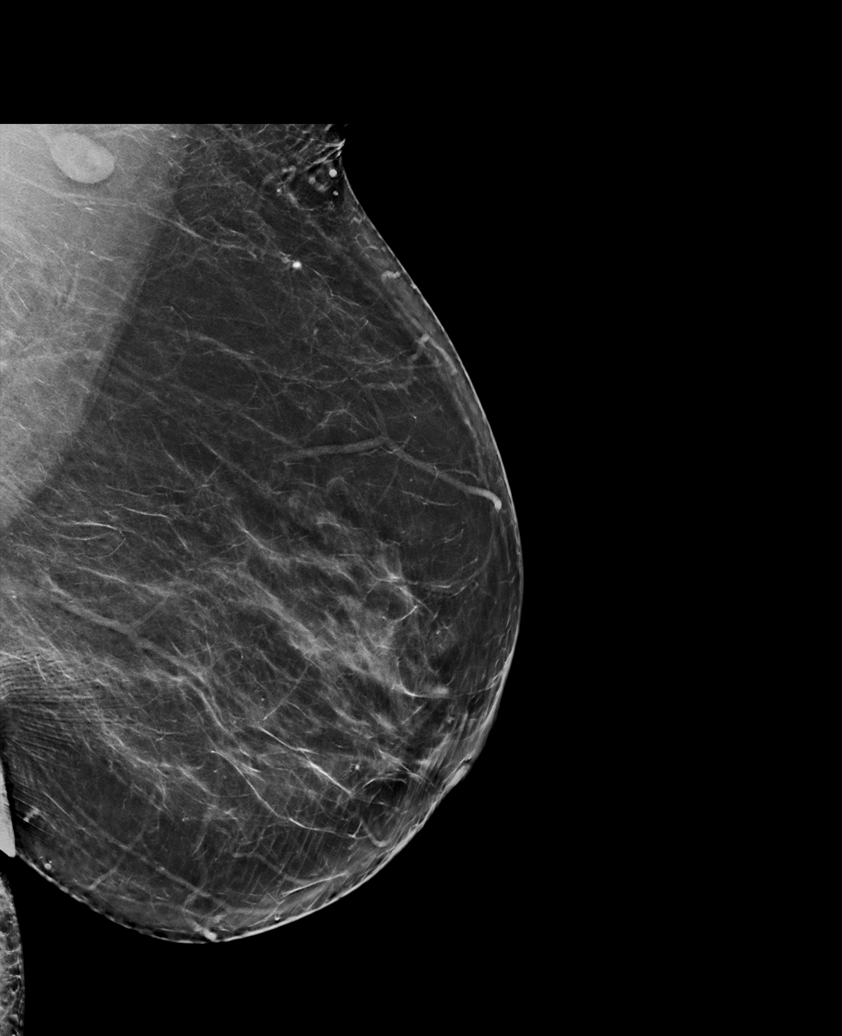

[R MLO synth-2D (1 of 2)]
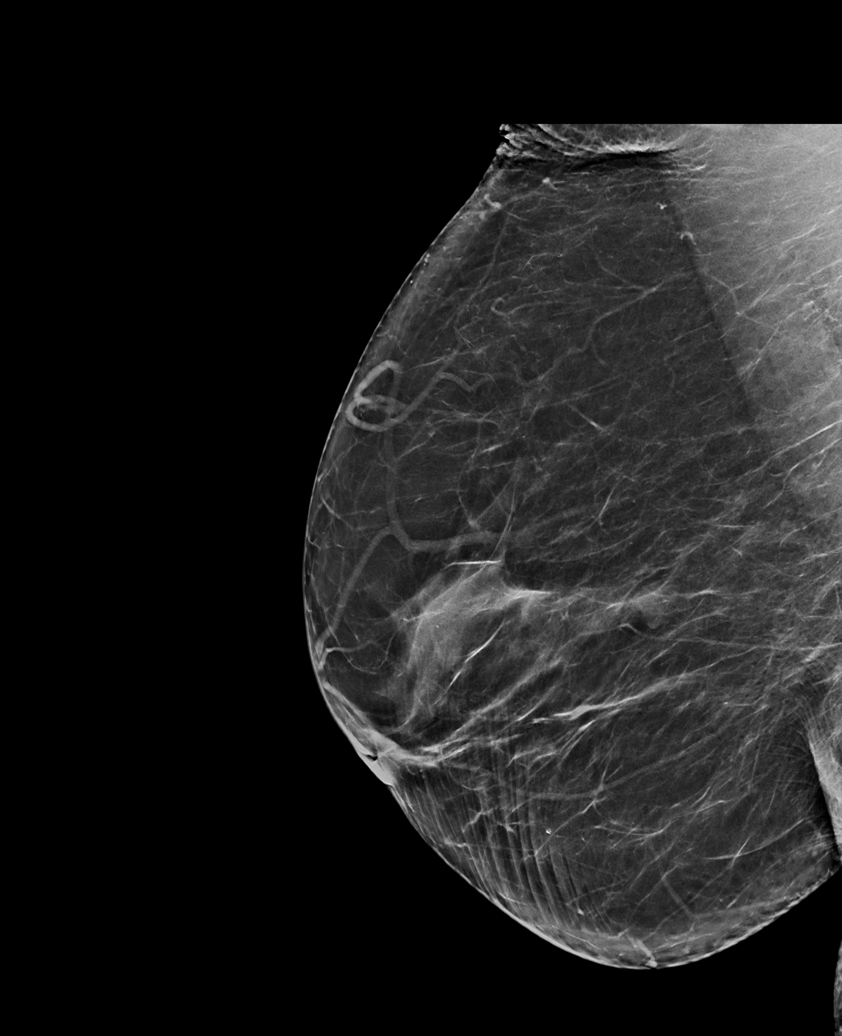

[R MLO synth-2D (2 of 2)]
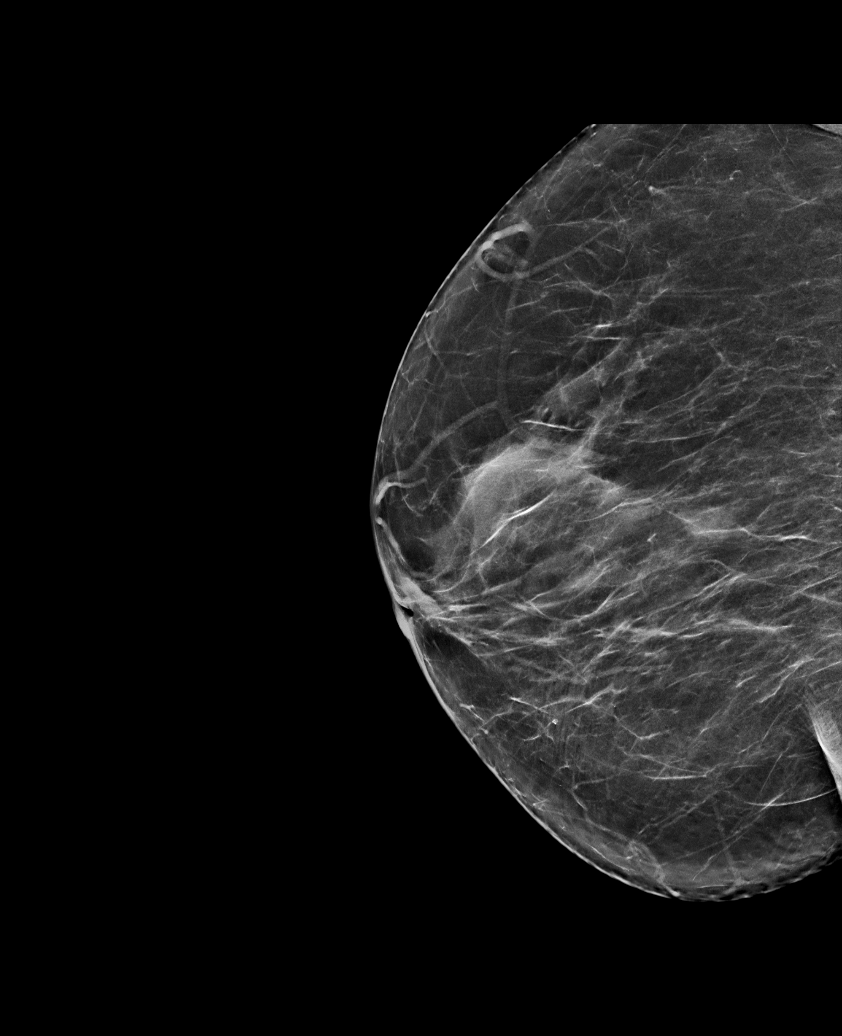

[L MLO tomo · tomo slice 41/80.0]
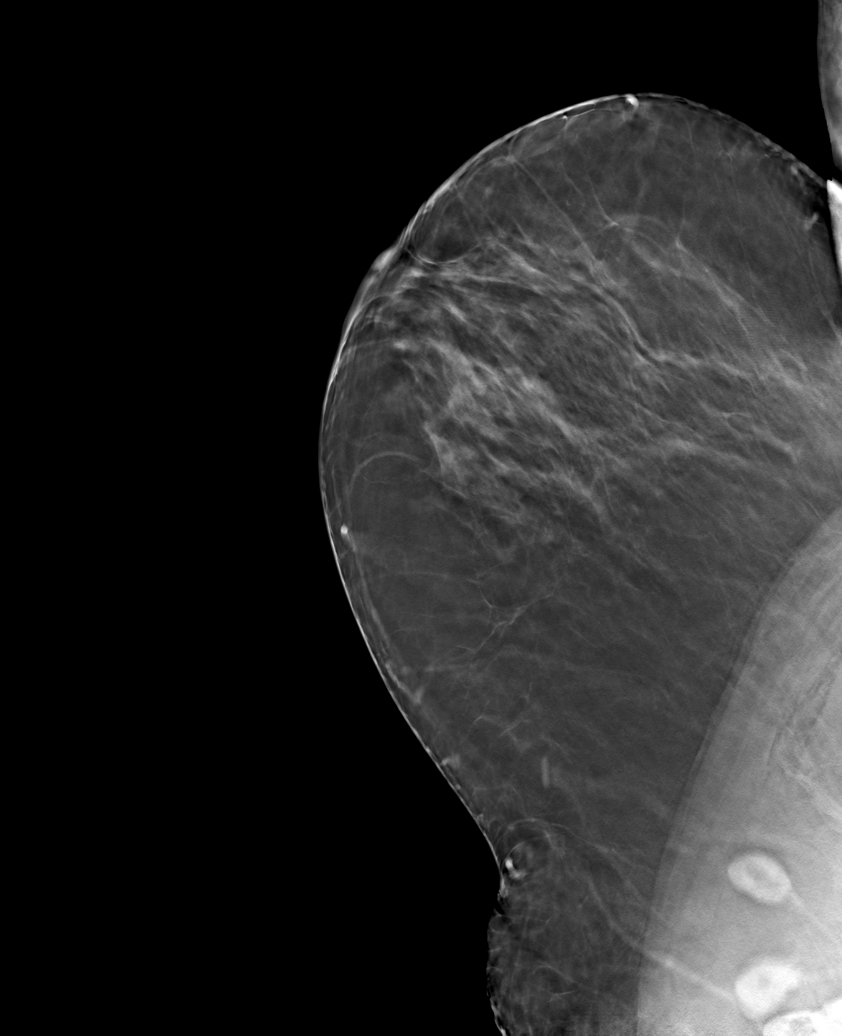

[6 of 30 positions shown; findings below may reference images not displayed]

ACR Breast Density Category b: There are scattered areas of
fibroglandular density.
FINDINGS: There are no findings suspicious for malignancy.
IMPRESSION: No mammographic evidence of malignancy. A result letter of this
screening mammogram will be mailed directly to the patient.

RECOMMENDATION:
Screening mammogram in one year. (Code:51-O-LD2)

BI-RADS CATEGORY  1: Negative.

## 2022-11-23 DIAGNOSIS — E1122 Type 2 diabetes mellitus with diabetic chronic kidney disease: Secondary | ICD-10-CM | POA: Diagnosis not present

## 2022-11-23 DIAGNOSIS — M1712 Unilateral primary osteoarthritis, left knee: Secondary | ICD-10-CM | POA: Diagnosis not present

## 2022-11-23 DIAGNOSIS — E782 Mixed hyperlipidemia: Secondary | ICD-10-CM | POA: Diagnosis not present

## 2022-11-23 DIAGNOSIS — N1831 Chronic kidney disease, stage 3a: Secondary | ICD-10-CM | POA: Diagnosis not present

## 2022-11-23 DIAGNOSIS — E1142 Type 2 diabetes mellitus with diabetic polyneuropathy: Secondary | ICD-10-CM | POA: Diagnosis not present

## 2022-11-23 DIAGNOSIS — E559 Vitamin D deficiency, unspecified: Secondary | ICD-10-CM | POA: Diagnosis not present

## 2022-11-23 DIAGNOSIS — I10 Essential (primary) hypertension: Secondary | ICD-10-CM | POA: Diagnosis not present

## 2022-12-22 DIAGNOSIS — E559 Vitamin D deficiency, unspecified: Secondary | ICD-10-CM | POA: Diagnosis not present

## 2022-12-22 DIAGNOSIS — Z1159 Encounter for screening for other viral diseases: Secondary | ICD-10-CM | POA: Diagnosis not present

## 2022-12-22 DIAGNOSIS — Z79899 Other long term (current) drug therapy: Secondary | ICD-10-CM | POA: Diagnosis not present

## 2023-03-05 DIAGNOSIS — N390 Urinary tract infection, site not specified: Secondary | ICD-10-CM | POA: Diagnosis not present

## 2023-03-05 DIAGNOSIS — N183 Chronic kidney disease, stage 3 unspecified: Secondary | ICD-10-CM | POA: Diagnosis not present
